# Patient Record
Sex: Female | Born: 1960 | Race: White | Hispanic: Yes | Marital: Married | Smoking: Never smoker
Health system: Southern US, Community
[De-identification: ages and names within clinical notes are randomized; demographics above are authoritative.]

## PROBLEM LIST (undated history)

## (undated) DIAGNOSIS — J45909 Unspecified asthma, uncomplicated: Secondary | ICD-10-CM

## (undated) DIAGNOSIS — E079 Disorder of thyroid, unspecified: Secondary | ICD-10-CM

## (undated) HISTORY — DX: Unspecified asthma, uncomplicated: J45.909

## (undated) HISTORY — DX: Disorder of thyroid, unspecified: E07.9

## (undated) HISTORY — PX: APPENDECTOMY: SHX54

---

## 1986-12-16 HISTORY — PX: HEMORRHOID SURGERY: SHX153

## 2011-05-22 ENCOUNTER — Ambulatory Visit: Payer: Self-pay | Admitting: Family Medicine

## 2012-08-20 ENCOUNTER — Ambulatory Visit: Payer: Self-pay | Admitting: Family Medicine

## 2012-09-01 ENCOUNTER — Ambulatory Visit: Payer: Self-pay | Admitting: Family Medicine

## 2012-11-18 ENCOUNTER — Ambulatory Visit: Payer: Self-pay | Admitting: General Surgery

## 2012-11-19 ENCOUNTER — Ambulatory Visit: Payer: Self-pay | Admitting: Family Medicine

## 2015-01-25 ENCOUNTER — Ambulatory Visit: Payer: Self-pay | Admitting: Obstetrics & Gynecology

## 2015-02-03 ENCOUNTER — Ambulatory Visit: Payer: Self-pay | Admitting: Obstetrics & Gynecology

## 2015-04-10 LAB — SURGICAL PATHOLOGY

## 2015-04-16 NOTE — Op Note (Signed)
PATIENT NAME:  Anna Mcgrath, Anna Mcgrath MR#:  [id number] DATE OF BIRTH:  [birth year]  DATE OF PROCEDURE:  02/03/2015  PREOPERATIVE DIAGNOSIS: Endometrial polyp and menorrhagia.   POSTOPERATIVE DIAGNOSIS:  Menorrhagia.   PROCEDURE: Dilation and curettage hysteroscopy.   SURGEON: Ranae Plumberhelsea Pinky Ravan, MD   ANESTHESIA: General.  OPERATIVE FLUIDS: 600 mL crystalloid.   URINE OUTPUT: Zero. Not performed.   ESTIMATED BLOOD LOSS: Minimal.   COMPLICATIONS: None apparent.   FINDINGS:  1.  Small endometrial cavity. No evidence of polyps, fluffy endometrial tissue consistent with the patient's menstrual status.  2.  Posterior cervix scarred in the posterior vaginal fornix.   SPECIMENS: Endometrial curettings.  CONDITION:  Stable to PACU.    INDICATION FOR PROCEDURE: The patient had complained of heavy menses and a sonohysterogram was performed, which showed evidence of an endometrial polyp in the posterior uterus. She was then scheduled to have dilation and curettage hysteroscopy for polypectomy. Informed consent was obtained.    OPERATIVE NOTE:  The patient was brought to the Operating Room where she was given general anesthesia in the supine position and placed in the dorsal lithotomy position and then prepped and draped in the usual sterile fashion. A surgical timeout was called. A bimanual exam was performed showing an anteverted and small uterus with scarring of the posterior cervix to the posterior vaginal fornix as described above.  The cervix was grasped at the 12 o'clock position with a single-tooth tenaculum and brought forward. The cervix was then dilated using Pratt dilators to accommodate the MyoSure scope which was then inserted without difficulty and findings as above. There was no endometrial polyp that was identified.  It is possible that the patient passed this on her previous menses or during this menses.  The MyoSure scope was then removed and gentle curettage was performed with tissue collected  and sent to pathology as endometrial curettings.  The hysteroscope was then reinserted for confirmation of completeness of the sampling of the cavity.  This was then removed.  All instruments were then removed from the patient and the procedure was deemed complete. The patient tolerated the procedure. The counts were correct x 2, and then the patient was brought to PACU in a stable condition for recovery.      ____________________________ Anna Fenderhelsea C. Alexys Gassett, MD ccw:DT D: 02/03/2015 14:24:56 ET T: 02/03/2015 14:39:48 ET JOB#: [id number]  cc: Leeroy Bockhelsea C. Clarissa Laird, MD, <Dictator> Leola BrazilHELSEA C Laylee Schooley MD ELECTRONICALLY SIGNED 02/03/2015 17:22

## 2015-12-26 ENCOUNTER — Other Ambulatory Visit: Payer: Self-pay | Admitting: Obstetrics & Gynecology

## 2015-12-26 DIAGNOSIS — Z1231 Encounter for screening mammogram for malignant neoplasm of breast: Secondary | ICD-10-CM

## 2015-12-28 ENCOUNTER — Other Ambulatory Visit: Payer: Self-pay | Admitting: Family Medicine

## 2015-12-28 DIAGNOSIS — N63 Unspecified lump in unspecified breast: Secondary | ICD-10-CM

## 2016-01-10 ENCOUNTER — Ambulatory Visit
Admission: RE | Admit: 2016-01-10 | Discharge: 2016-01-10 | Disposition: A | Discharge status: Home / Self Care | Payer: BLUE CROSS/BLUE SHIELD | Source: Ambulatory Visit | Attending: Family Medicine | Admitting: Family Medicine

## 2016-01-10 DIAGNOSIS — N63 Unspecified lump in unspecified breast: Secondary | ICD-10-CM

## 2016-01-11 ENCOUNTER — Other Ambulatory Visit: Payer: Self-pay | Admitting: Family Medicine

## 2016-01-11 DIAGNOSIS — N63 Unspecified lump in unspecified breast: Secondary | ICD-10-CM

## 2016-01-19 ENCOUNTER — Ambulatory Visit
Admission: RE | Admit: 2016-01-19 | Discharge: 2016-01-19 | Disposition: A | Discharge status: Home / Self Care | Payer: BLUE CROSS/BLUE SHIELD | Source: Ambulatory Visit | Attending: Family Medicine | Admitting: Family Medicine

## 2016-01-19 ENCOUNTER — Ambulatory Visit: Payer: BLUE CROSS/BLUE SHIELD

## 2016-01-19 ENCOUNTER — Other Ambulatory Visit: Payer: BLUE CROSS/BLUE SHIELD

## 2016-01-19 DIAGNOSIS — N63 Unspecified lump in unspecified breast: Secondary | ICD-10-CM

## 2016-01-19 DIAGNOSIS — N62 Hypertrophy of breast: Secondary | ICD-10-CM | POA: Diagnosis not present

## 2016-01-19 DIAGNOSIS — N6082 Other benign mammary dysplasias of left breast: Secondary | ICD-10-CM | POA: Diagnosis not present

## 2016-01-19 DIAGNOSIS — N6032 Fibrosclerosis of left breast: Secondary | ICD-10-CM | POA: Insufficient documentation

## 2016-01-19 HISTORY — PX: BREAST CYST ASPIRATION: SHX578

## 2016-01-22 LAB — SURGICAL PATHOLOGY

## 2016-01-31 HISTORY — PX: BREAST BIOPSY: SHX20

## 2017-02-18 ENCOUNTER — Other Ambulatory Visit: Payer: Self-pay | Admitting: Obstetrics & Gynecology

## 2017-02-18 DIAGNOSIS — Z1231 Encounter for screening mammogram for malignant neoplasm of breast: Secondary | ICD-10-CM

## 2017-03-05 ENCOUNTER — Ambulatory Visit: Payer: BLUE CROSS/BLUE SHIELD

## 2017-03-10 ENCOUNTER — Telehealth: Payer: Self-pay | Admitting: Obstetrics & Gynecology

## 2017-03-10 NOTE — Telephone Encounter (Signed)
[facility] sent in [facility] refill for patients Medication for Mimvey. Per Kim [place]azil[place] Pt needs to be schedule for annual before refilled. Pt former Ward Pt

## 2017-03-12 NOTE — Telephone Encounter (Signed)
Pt needs an annual exam due to Dr.Ward no longer with [facility]. Prescription not Authorized

## 2017-03-12 NOTE — Telephone Encounter (Signed)
Pt's prescription was faxed on March 22 for Refill on Mimvey1-0.5MG  Tablet

## 2017-04-01 ENCOUNTER — Ambulatory Visit
Admission: RE | Admit: 2017-04-01 | Discharge: 2017-04-01 | Disposition: A | Discharge status: Home / Self Care | Payer: BLUE CROSS/BLUE SHIELD | Source: Ambulatory Visit | Attending: Obstetrics & Gynecology | Admitting: Obstetrics & Gynecology

## 2017-04-01 DIAGNOSIS — Z1231 Encounter for screening mammogram for malignant neoplasm of breast: Secondary | ICD-10-CM | POA: Diagnosis present

## 2018-02-19 ENCOUNTER — Other Ambulatory Visit: Payer: Self-pay | Admitting: Obstetrics & Gynecology

## 2018-02-19 DIAGNOSIS — N644 Mastodynia: Secondary | ICD-10-CM

## 2018-03-03 ENCOUNTER — Ambulatory Visit
Admission: RE | Admit: 2018-03-03 | Discharge: 2018-03-03 | Disposition: A | Discharge status: Home / Self Care | Payer: BLUE CROSS/BLUE SHIELD | Source: Ambulatory Visit | Attending: Obstetrics & Gynecology | Admitting: Obstetrics & Gynecology

## 2018-03-03 ENCOUNTER — Other Ambulatory Visit: Payer: BLUE CROSS/BLUE SHIELD

## 2018-03-03 DIAGNOSIS — N644 Mastodynia: Secondary | ICD-10-CM

## 2018-03-03 DIAGNOSIS — N6002 Solitary cyst of left breast: Secondary | ICD-10-CM | POA: Diagnosis not present

## 2018-03-05 ENCOUNTER — Other Ambulatory Visit: Payer: Self-pay | Admitting: Obstetrics & Gynecology

## 2018-03-05 DIAGNOSIS — R928 Other abnormal and inconclusive findings on diagnostic imaging of breast: Secondary | ICD-10-CM

## 2018-03-05 DIAGNOSIS — N6002 Solitary cyst of left breast: Secondary | ICD-10-CM

## 2018-03-10 ENCOUNTER — Other Ambulatory Visit: Payer: Self-pay | Admitting: Obstetrics & Gynecology

## 2018-03-10 ENCOUNTER — Ambulatory Visit
Admission: RE | Admit: 2018-03-10 | Discharge: 2018-03-10 | Disposition: A | Discharge status: Home / Self Care | Payer: BLUE CROSS/BLUE SHIELD | Source: Ambulatory Visit | Attending: Obstetrics & Gynecology | Admitting: Obstetrics & Gynecology

## 2018-03-10 DIAGNOSIS — N6002 Solitary cyst of left breast: Secondary | ICD-10-CM

## 2018-03-10 DIAGNOSIS — R928 Other abnormal and inconclusive findings on diagnostic imaging of breast: Secondary | ICD-10-CM

## 2018-03-10 HISTORY — PX: BREAST CYST ASPIRATION: SHX578

## 2018-03-12 LAB — CYTOLOGY - NON PAP

## 2018-08-28 ENCOUNTER — Inpatient Hospital Stay: Payer: BLUE CROSS/BLUE SHIELD | Attending: Oncology | Admitting: Oncology

## 2018-08-28 ENCOUNTER — Inpatient Hospital Stay: Payer: BLUE CROSS/BLUE SHIELD | Admitting: Oncology

## 2018-08-28 ENCOUNTER — Encounter: Payer: Self-pay | Admitting: Oncology

## 2018-08-28 ENCOUNTER — Encounter (INDEPENDENT_AMBULATORY_CARE_PROVIDER_SITE_OTHER): Payer: Self-pay

## 2018-08-28 NOTE — Progress Notes (Signed)
Hematology/Oncology Consult note Anna Mcgrath [phone] Fax:([phone]   Patient Care Team: Lynnea FerrierKlein, Bert J III, MD as PCP - General (Internal Medicine)  REFERRING PROVIDER: Lynnea FerrierKlein, Bert J III, MD CHIEF COMPLAINTS/REASON FOR VISIT:  Evaluation of hemochromatosis.   HISTORY OF PRESENTING ILLNESS:  Anna Mcgrath is a  57 y.o.  female with PMH listed below who was referred to me for evaluation of hemochromatosis.  [facility]  Reviewed lab report from [facility]  08/10/2018 Ferritin 446, wbc 6.7, Hemoglobin 13.8, MCV 99, normal differential, TSH 1.56, Creatinine 0.79,  She had a repeat test done on 08/14/2018  ferritin 580, serum iron 178, iron saturation 63% TIBC 284.  B12 496, Folate 16.9.  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  She reports chronic fatigue and also hand joint pain from chronic arthritis.   Denies family history of hemochromatosis. Her parents are deceased. Patient feels that " my parents may have passed one hemochromatosis gene to me".   Review of Systems  Constitutional: Positive for malaise/fatigue. Negative for chills, fever and weight loss.  HENT: Negative for nosebleeds and sore throat.   Eyes: Negative for double vision, photophobia and redness.  Respiratory: Negative for cough, shortness of breath and wheezing.   Cardiovascular: Negative for chest pain, palpitations and orthopnea.  Gastrointestinal: Negative for abdominal pain, blood in stool, nausea and vomiting.  Genitourinary: Negative for dysuria.  Musculoskeletal: Positive for joint pain. Negative for back pain, myalgias and neck pain.  Skin: Negative for itching and rash.  Neurological: Negative for dizziness, tingling and tremors.  Endo/Heme/Allergies: Negative for environmental allergies. Does not bruise/bleed easily.  Psychiatric/Behavioral: Negative for depression.    MEDICAL HISTORY:  Past Medical  History:  Diagnosis Date  . Asthma   . Thyroid disease     SURGICAL HISTORY: Past Surgical History:  Procedure Laterality Date  . APPENDECTOMY    . BREAST BIOPSY Left 01/31/2016   Fibrocystic changes  . BREAST CYST ASPIRATION Left 01/19/2016  . HEMORRHOID SURGERY  1988    SOCIAL HISTORY: Social History   Socioeconomic History  . Marital status: Married    Spouse name: Jonny RuizJohn   . Number of children: 2  . Years of education: Not on file  . Highest education level: Not on file  Occupational History  . Not on file  Social Needs  . Financial resource strain: Not on file  . Food insecurity:    Worry: Not on file    Inability: Not on file  . Transportation needs:    Medical: Not on file    Non-medical: Not on file  Tobacco Use  . Smoking status: Never Smoker  . Smokeless tobacco: Never Used  Substance and Sexual Activity  . Alcohol use: Not Currently  . Drug use: Never  . Sexual activity: Not on file  Lifestyle  . Physical activity:    Days per week: Not on file    Minutes per session: Not on file  . Stress: Not on file  Relationships  . Social connections:    Talks on phone: Not on file    Gets together: Not on file    Attends religious service: Not on file    Active member of club or organization: Not on file    Attends meetings of clubs or organizations: Not on file    Relationship status: Not on file  . Intimate partner violence:    Fear of current or  ex partner: Not on file    Emotionally abused: Not on file    Physically abused: Not on file    Forced sexual activity: Not on file  Other Topics Concern  . Not on file  Social History Narrative  . Not on file    FAMILY HISTORY: Family History  Problem Relation Age of Onset  . Non-Hodgkin's lymphoma Brother   . Cancer Maternal Grandfather   . Breast cancer Neg Hx     ALLERGIES:  is allergic to azithromycin; gatifloxacin; and penicillin g.  MEDICATIONS:  Current Outpatient Medications  Medication  Sig Dispense Refill  . BIOTIN PO Take by mouth.    . budesonide (PULMICORT FLEXHALER) 180 MCG/ACT inhaler Inhale into the lungs.    . Cholecalciferol (VITAMIN D-1000 MAX ST) 1000 units tablet Take 1,000 Units by mouth daily.    Marland Kitchen estradiol-norethindrone (MIMVEY) 1-0.5 MG tablet TAKE 1 TABLET BY MOUTH EVERY DAY    . Flaxseed Oil (LINSEED OIL) OIL Use    . levalbuterol (XOPENEX HFA) 45 MCG/ACT inhaler Inhale into the lungs.    Marland Kitchen levothyroxine (SYNTHROID, LEVOTHROID) 88 MCG tablet Take 88 mcg by mouth daily.    Marland Kitchen spironolactone (ALDACTONE) 50 MG tablet Take 50 mg by mouth 2 (two) times daily.  5   No current facility-administered medications for this visit.      PHYSICAL EXAMINATION: ECOG PERFORMANCE STATUS: 0 - Asymptomatic Vitals:   08/28/18 1149  BP: 119/80  Pulse: 84  Resp: 16  Temp: 97.7 F (36.5 C)   Filed Weights   08/28/18 1149  Weight: 145 lb 9 oz (66 kg)    Physical Exam  Constitutional: She is oriented to person, place, and time. She appears well-nourished. No distress.  HENT:  Head: Normocephalic and atraumatic.  Right Ear: External ear normal.  Left Ear: External ear normal.  Mouth/Throat: Oropharynx is clear and moist.  Eyes: Pupils are equal, round, and reactive to light. EOM are normal. No scleral icterus.  Neck: Normal range of motion. Neck supple.  Cardiovascular: Normal rate, regular rhythm and normal heart sounds.  Pulmonary/Chest: Effort normal. No respiratory distress. She has no wheezes.  Abdominal: Soft. Bowel sounds are normal. She exhibits no distension and no mass. There is no tenderness.  Musculoskeletal: Normal range of motion. She exhibits no edema or deformity.  No joint swelling.   Neurological: She is alert and oriented to person, place, and time. No cranial nerve deficit. Coordination normal.  Skin: Skin is warm and dry. No rash noted. No erythema.  Psychiatric: She has a normal mood and affect. Her behavior is normal. Thought content  normal.     LABORATORY DATA:  I have reviewed the data as listed No results found for: WBC, HGB, HCT, MCV, PLT No results for input(s): NA, K, CL, CO2, GLUCOSE, BUN, CREATININE, CALCIUM, GFRNONAA, GFRAA, PROT, ALBUMIN, AST, ALT, ALKPHOS, BILITOT, BILIDIR, IBILI in the last 8760 hours. Iron/TIBC/Ferritin/ %Sat No results found for: IRON, TIBC, FERRITIN, IRONPCTSAT      ASSESSMENT & PLAN:  1. Iron overload   2. Hereditary hemochromatosis (HCC)    Diagnosis of hereditary hemochromatosis discussed with patient.  Advised patient to have first-degree relative screening for hemochromatosis.  Avoid alcohol consumption.  Avoid uncooked seafood. Patient voices understanding.  Check LFT, check US abdomen.   # recommend weekly Phlebotomy x 4. Patient asks if she can donate blood. Discussed with patient that she is able to donate blood. Check H&H prior to each phlebotomy.  Repeat iron  panel in 5 weeks and decide if further frequency of phlebotomy. [facility] orders provided to patient.   Orders Placed This Encounter  Procedures  . US Abdomen Complete    Standing Status:   Future    Standing Expiration Date:   08/29/2019    Order Specific Question:   Reason for Exam (SYMPTOM  OR DIAGNOSIS REQUIRED)    Answer:   hemochromatosis    Order Specific Question:   Preferred imaging location?    Answer:   [facility]    All questions were answered. The patient knows to call the clinic with any problems questions or concerns.  Return of visit: 5 weeks.  Thank you for this kind referral and the opportunity to participate in the care of this patient. A copy of today's note is routed to referring provider  Total face to face encounter time for this patient visit was 45 min. >50% of the time was  spent in counseling and coordination of care.    Rickard Patience, MD, PhD Hematology Oncology [facility] at [facility] Pager- [phone] 08/28/2018

## 2018-08-28 NOTE — Progress Notes (Signed)
Patient here today as a new patient with hereditary hemochromatosis

## 2018-08-30 ENCOUNTER — Encounter: Payer: Self-pay | Admitting: Oncology

## 2018-09-01 ENCOUNTER — Inpatient Hospital Stay: Payer: BLUE CROSS/BLUE SHIELD

## 2018-09-01 NOTE — Progress Notes (Signed)
Lab results reviewed with MD, Dr. Cathie HoopsYu. Per MD order: proceed with phlebotomy today.

## 2018-09-07 ENCOUNTER — Encounter: Payer: Self-pay | Admitting: Oncology

## 2018-09-08 ENCOUNTER — Inpatient Hospital Stay: Payer: BLUE CROSS/BLUE SHIELD

## 2018-09-09 ENCOUNTER — Encounter: Payer: Self-pay | Admitting: Oncology

## 2018-09-15 ENCOUNTER — Inpatient Hospital Stay: Payer: BLUE CROSS/BLUE SHIELD | Attending: Oncology

## 2018-09-15 ENCOUNTER — Inpatient Hospital Stay: Payer: BLUE CROSS/BLUE SHIELD

## 2018-09-15 MED ORDER — SODIUM CHLORIDE 0.9 % IV SOLN
Freq: Once | INTRAVENOUS | Status: AC
  Administered 2018-09-15: 14:00:00 via INTRAVENOUS
  Filled 2018-09-15: qty 250

## 2018-09-22 ENCOUNTER — Other Ambulatory Visit: Payer: Self-pay | Admitting: Oncology

## 2018-09-22 ENCOUNTER — Inpatient Hospital Stay: Payer: BLUE CROSS/BLUE SHIELD

## 2018-10-02 ENCOUNTER — Inpatient Hospital Stay: Payer: BLUE CROSS/BLUE SHIELD

## 2018-10-02 ENCOUNTER — Ambulatory Visit: Payer: BLUE CROSS/BLUE SHIELD | Admitting: Oncology

## 2018-10-02 ENCOUNTER — Other Ambulatory Visit: Payer: Self-pay

## 2018-10-02 ENCOUNTER — Inpatient Hospital Stay (HOSPITAL_BASED_OUTPATIENT_CLINIC_OR_DEPARTMENT_OTHER): Payer: BLUE CROSS/BLUE SHIELD | Admitting: Oncology

## 2018-10-02 ENCOUNTER — Encounter: Payer: Self-pay | Admitting: Oncology

## 2018-10-02 DIAGNOSIS — R5383 Other fatigue: Secondary | ICD-10-CM

## 2018-10-02 MED ORDER — SODIUM CHLORIDE 0.9 % IV SOLN
Freq: Once | INTRAVENOUS | Status: AC
  Administered 2018-10-02: 14:00:00 via INTRAVENOUS
  Filled 2018-10-02: qty 250

## 2018-10-02 NOTE — Progress Notes (Signed)
Therapeutic phlebotomy performed per MD order starting at 1414, and finishing at 1425 removing a total of per MD order. Pt tolerated well, and IVFs started per MD order.  Pt tolerated procedure well. Pt and VS stable at discharge.

## 2018-10-02 NOTE — Progress Notes (Signed)
Patient here for follow up. No concerns voiced.  °

## 2018-10-03 NOTE — Progress Notes (Signed)
Hematology/Oncology Consult note [facility] Telephone:([phone] Fax:([phone]) [phone]   Patient Care Team: Lynnea Ferrier, MD as PCP - General (Internal Medicine)  REFERRING PROVIDER: Lynnea Ferrier, MD REASON FOR VISIT Follow up for treatment of iron overload and hemochromatosis.   HISTORY OF PRESENTING ILLNESS:  Anna Mcgrath is a  57 y.o.  female with PMH listed below who was referred to me for evaluation of hemochromatosis.  Patient recent had lab work up done at [facility] office.  Reviewed lab report from [facility]  08/10/2018 Ferritin 446, wbc 6.7, Hemoglobin 13.8, MCV 99, normal differential, TSH 1.56, Creatinine 0.79,  She had a repeat test done on 08/14/2018  ferritin 580, serum iron 178, iron saturation 63% TIBC 284.  B12 496, Folate 16.9.  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  She reports chronic fatigue and also hand joint pain from chronic arthritis.   Denies family history of hemochromatosis. Her parents are deceased. Patient feels that " my parents may have passed one hemochromatosis gene to me".   INTERVAL HISTORY Anna Mcgrath is a 57 y.o. 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  S.p weekly phlebotomy 500cc x 3. She feels very fatigue and did not get 4th phlebotomy done.  Feels better today.    Review of Systems  Constitutional: Positive for malaise/fatigue. Negative for chills, fever and weight loss.  HENT: Negative for nosebleeds and sore throat.   Eyes: Negative for double vision, photophobia and redness.  Respiratory: Negative for cough, shortness of breath and wheezing.   Cardiovascular: Negative for chest pain, palpitations and orthopnea.  Gastrointestinal: Negative for abdominal pain, blood in stool, nausea and vomiting.  Genitourinary: Negative for dysuria.  Musculoskeletal: Positive for joint pain. Negative  for back pain, myalgias and neck pain.  Skin: Negative for itching and rash.  Neurological: Negative for dizziness, tingling and tremors.  Endo/Heme/Allergies: Negative for environmental allergies. Does not bruise/bleed easily.  Psychiatric/Behavioral: Negative for depression.    MEDICAL HISTORY:  Past Medical History:  Diagnosis Date  . Asthma   . Thyroid disease     SURGICAL HISTORY: Past Surgical History:  Procedure Laterality Date  . APPENDECTOMY    . BREAST BIOPSY Left 01/31/2016   Fibrocystic changes  . BREAST CYST ASPIRATION Left 01/19/2016  . HEMORRHOID SURGERY  1988    SOCIAL HISTORY: Social History   Socioeconomic History  . Marital status: Married    Spouse name: Jonny Ruiz   . Number of children: 2  . Years of education: Not on file  . Highest education level: Not on file  Occupational History  . Not on file  Social Needs  . Financial resource strain: Not on file  . Food insecurity:    Worry: Not on file    Inability: Not on file  . Transportation needs:    Medical: Not on file    Non-medical: Not on file  Tobacco Use  . Smoking status: Never Smoker  . Smokeless tobacco: Never Used  Substance and Sexual Activity  . Alcohol use: Not Currently  . Drug use: Never  . Sexual activity: Not on file  Lifestyle  . Physical activity:    Days per week: Not on file    Minutes per session: Not on file  . Stress: Not on file  Relationships  . Social connections:    Talks on phone: Not on file  Gets together: Not on file    Attends religious service: Not on file    Active member of club or organization: Not on file    Attends meetings of clubs or organizations: Not on file    Relationship status: Not on file  . Intimate partner violence:    Fear of current or ex partner: Not on file    Emotionally abused: Not on file    Physically abused: Not on file    Forced sexual activity: Not on file  Other Topics Concern  . Not on file  Social History Narrative  .  Not on file    FAMILY HISTORY: Family History  Problem Relation Age of Onset  . Non-Hodgkin's lymphoma Brother   . Cancer Maternal Grandfather   . Breast cancer Neg Hx     ALLERGIES:  is allergic to azithromycin; gatifloxacin; and penicillin g.  MEDICATIONS:  Current Outpatient Medications  Medication Sig Dispense Refill  . BIOTIN PO Take by mouth.    . budesonide (PULMICORT FLEXHALER) 180 MCG/ACT inhaler Inhale into the lungs.    . Cholecalciferol (VITAMIN D-1000 MAX ST) 1000 units tablet Take 1,000 Units by mouth daily.    Marland Kitchen estradiol-norethindrone (MIMVEY) 1-0.5 MG tablet TAKE 1 TABLET BY MOUTH EVERY DAY    . Flaxseed Oil (LINSEED OIL) OIL Use    . levalbuterol (XOPENEX HFA) 45 MCG/ACT inhaler Inhale into the lungs.    Marland Kitchen levothyroxine (SYNTHROID, LEVOTHROID) 88 MCG tablet Take 88 mcg by mouth daily.    Marland Kitchen spironolactone (ALDACTONE) 50 MG tablet Take 50 mg by mouth 2 (two) times daily.  5   No current facility-administered medications for this visit.      PHYSICAL EXAMINATION: ECOG PERFORMANCE STATUS: 0 - Asymptomatic Vitals:   10/02/18 1320  BP: (!) 130/91  Pulse: (!) 101  Resp: 18  Temp: (!) 97.1 F (36.2 C)   Filed Weights   10/02/18 1320  Weight: 147 lb 4.8 oz (66.8 kg)    Physical Exam  Constitutional: She is oriented to person, place, and time. She appears well-nourished. No distress.  HENT:  Head: Normocephalic and atraumatic.  Right Ear: External ear normal.  Left Ear: External ear normal.  Mouth/Throat: Oropharynx is clear and moist.  Eyes: Pupils are equal, round, and reactive to light. EOM are normal. No scleral icterus.  Neck: Normal range of motion. Neck supple.  Cardiovascular: Normal rate, regular rhythm and normal heart sounds.  Pulmonary/Chest: Effort normal. No respiratory distress. She has no wheezes.  Abdominal: Soft. Bowel sounds are normal. She exhibits no distension and no mass. There is no tenderness.  Musculoskeletal: Normal range of  motion. She exhibits no edema or deformity.  Neurological: She is alert and oriented to person, place, and time. No cranial nerve deficit. Coordination normal.  Skin: Skin is warm and dry. No rash noted. No erythema.  Psychiatric: She has a normal mood and affect. Her behavior is normal. Thought content normal.     LABORATORY DATA:  [facility] labs reviewed.   Hemoglobin 12, ferritin 186.    ASSESSMENT & PLAN:  1. Hereditary hemochromatosis (HCC)   2. Iron overload   3. Other fatigue    # Labs reviewed and discussed with patient.  Ferritin has responded to phlebotomy and decreased to 186.  Discussed with patient that her goal of ferritin will at least less than 100, preferably less than 50.  And it is usually acceptable for phlebotomize to a mild anemia state if patient tolerates.  Since she is feeling fatigue, will space out phlebotomy frequency to every 2 weeks and also reduce to for each phlebotomy with holding parameter of Hb <11.   US abdomen was previously ordered and patient did not want to do now, wants to delay it to next year.  # Proceed with phlebotomy today.  recommend Q2 weeks Phlebotomy x 3 check H&H prior to each phlebotomy.   Repeat iron panel in 8- 10 weeks and decide if further frequency of phlebotomy. [facility] orders provided to patient.    Patient is quite anxious and has lot of questions. We spent sufficient time to discuss many aspect of care, questions were answered to patient's satisfaction. The patient knows to call the clinic with any problems questions or concerns.  Return of visit: 8-10 weeks.  Total face to face encounter time for this patient visit was 15 min. >50% of the time was  spent in counseling and coordination of care.  Rickard Patience, MD, PhD Hematology Oncology [facility] Pager- [phone] 10/03/2018

## 2018-10-20 ENCOUNTER — Inpatient Hospital Stay: Payer: BLUE CROSS/BLUE SHIELD | Attending: Oncology

## 2018-10-20 MED ORDER — SODIUM CHLORIDE 0.9 % IV SOLN
Freq: Once | INTRAVENOUS | Status: AC
  Administered 2018-10-20: 14:00:00 via INTRAVENOUS
  Filled 2018-10-20: qty 250

## 2018-11-03 ENCOUNTER — Inpatient Hospital Stay: Payer: BLUE CROSS/BLUE SHIELD

## 2018-11-03 MED ORDER — SODIUM CHLORIDE 0.9 % IV SOLN
Freq: Once | INTRAVENOUS | Status: AC
  Administered 2018-11-03: 14:00:00 via INTRAVENOUS
  Filled 2018-11-03: qty 250

## 2018-11-17 ENCOUNTER — Inpatient Hospital Stay: Payer: BLUE CROSS/BLUE SHIELD

## 2018-11-20 ENCOUNTER — Inpatient Hospital Stay: Payer: BLUE CROSS/BLUE SHIELD | Attending: Oncology

## 2018-11-20 MED ORDER — SODIUM CHLORIDE 0.9 % IV SOLN
Freq: Once | INTRAVENOUS | Status: AC
  Administered 2018-11-20: 14:00:00 via INTRAVENOUS
  Filled 2018-11-20: qty 250

## 2018-11-20 NOTE — Progress Notes (Signed)
Therapeutic phlebotomy performed per MD order starting at 1343 and ending at 1356 using a 20 gauge PIV in left AC, removing a total of 300cc. Pt tolerated procedure well.  Pt and VS stable at discharge.

## 2018-12-01 ENCOUNTER — Inpatient Hospital Stay (HOSPITAL_BASED_OUTPATIENT_CLINIC_OR_DEPARTMENT_OTHER): Payer: BLUE CROSS/BLUE SHIELD | Admitting: Oncology

## 2018-12-01 ENCOUNTER — Encounter: Payer: Self-pay | Admitting: Oncology

## 2018-12-01 ENCOUNTER — Other Ambulatory Visit: Payer: Self-pay

## 2018-12-01 ENCOUNTER — Inpatient Hospital Stay: Payer: BLUE CROSS/BLUE SHIELD

## 2018-12-01 NOTE — Progress Notes (Signed)
Hematology/Oncology follow up note [facility] Telephone:([phone] Fax:(336) [phone]   Patient Care Team: Lynnea Ferrier, MD as PCP - General (Internal Medicine)  REFERRING PROVIDER: Lynnea Ferrier, MD REASON FOR VISIT Follow up for treatment of iron overload and hemochromatosis.   HISTORY OF PRESENTING ILLNESS:  Anna Mcgrath is a  57 y.o.  female with PMH listed below who was referred to me for evaluation of hemochromatosis.  Patient recent had lab work up done at [facility] office.  Reviewed lab report from [facility].  08/10/2018 Ferritin 446, wbc 6.7, Hemoglobin 13.8, MCV 99, normal differential, TSH 1.56, Creatinine 0.79,  She had a repeat test done on 08/14/2018  ferritin 580, serum iron 178, iron saturation 63% TIBC 284.  B12 496, Folate 16.9.  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  She reports chronic fatigue and also hand joint pain from chronic arthritis.   Denies family history of hemochromatosis. Her parents are deceased. Patient feels that " my parents may have passed one hemochromatosis gene to me".   INTERVAL HISTORY Anna Mcgrath is a 57 y.o. 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   Reports feeling well today. Having stomach discomfort including nausea, bloating, acid reflux. She was started omeprazole.  Fatigue is better. No new complaints.    Review of Systems  Constitutional: Negative for chills, fever, malaise/fatigue and weight loss.  HENT: Negative for sore throat.   Eyes: Negative for redness.  Respiratory: Negative for cough, shortness of breath and wheezing.   Cardiovascular: Negative for chest pain, palpitations and leg swelling.  Gastrointestinal: Positive for heartburn and nausea. Negative for abdominal pain, blood in stool and vomiting.  Genitourinary: Negative for dysuria.  Musculoskeletal: Negative  for myalgias.  Skin: Negative for rash.  Neurological: Negative for dizziness, tingling and tremors.  Endo/Heme/Allergies: Does not bruise/bleed easily.  Psychiatric/Behavioral: Negative for hallucinations.    MEDICAL HISTORY:  Past Medical History:  Diagnosis Date  . Asthma   . Thyroid disease     SURGICAL HISTORY: Past Surgical History:  Procedure Laterality Date  . APPENDECTOMY    . BREAST BIOPSY Left 01/31/2016   Fibrocystic changes  . BREAST CYST ASPIRATION Left 01/19/2016  . HEMORRHOID SURGERY  1988    SOCIAL HISTORY: Social History   Socioeconomic History  . Marital status: Married    Spouse name: Jonny Ruiz   . Number of children: 2  . Years of education: Not on file  . Highest education level: Not on file  Occupational History  . Not on file  Social Needs  . Financial resource strain: Not on file  . Food insecurity:    Worry: Not on file    Inability: Not on file  . Transportation needs:    Medical: Not on file    Non-medical: Not on file  Tobacco Use  . Smoking status: Never Smoker  . Smokeless tobacco: Never Used  Substance and Sexual Activity  . Alcohol use: Not Currently  . Drug use: Never  . Sexual activity: Not on file  Lifestyle  . Physical activity:    Days per week: Not on file    Minutes per session: Not on file  . Stress: Not on file  Relationships  . Social connections:    Talks on phone: Not on file    Gets together: Not on file    Attends religious service: Not  on file    Active member of club or organization: Not on file    Attends meetings of clubs or organizations: Not on file    Relationship status: Not on file  . Intimate partner violence:    Fear of current or ex partner: Not on file    Emotionally abused: Not on file    Physically abused: Not on file    Forced sexual activity: Not on file  Other Topics Concern  . Not on file  Social History Narrative  . Not on file    FAMILY HISTORY: Family History  Problem Relation  Age of Onset  . Non-Hodgkin's lymphoma Brother   . Cancer Maternal Grandfather   . Breast cancer Neg Hx     ALLERGIES:  is allergic to azithromycin; gatifloxacin; and penicillin g.  MEDICATIONS:  Current Outpatient Medications  Medication Sig Dispense Refill  . budesonide (PULMICORT FLEXHALER) 180 MCG/ACT inhaler Inhale into the lungs.    Marland Kitchen. estradiol-norethindrone (MIMVEY) 1-0.5 MG tablet TAKE 1 TABLET BY MOUTH EVERY DAY    . levalbuterol (XOPENEX HFA) 45 MCG/ACT inhaler Inhale into the lungs.    Marland Kitchen. levothyroxine (SYNTHROID, LEVOTHROID) 88 MCG tablet Take 88 mcg by mouth daily.    Marland Kitchen. spironolactone (ALDACTONE) 50 MG tablet Take 50 mg by mouth 2 (two) times daily.  5  . BIOTIN PO Take by mouth.    . Cholecalciferol (VITAMIN D-1000 MAX ST) 1000 units tablet Take 1,000 Units by mouth daily.    . Flaxseed Oil (LINSEED OIL) OIL Use    . omeprazole (PRILOSEC) 20 MG capsule Take 20 mg by mouth 2 (two) times daily before a meal.   1   No current facility-administered medications for this visit.      PHYSICAL EXAMINATION: ECOG PERFORMANCE STATUS: 0 - Asymptomatic Vitals:   12/01/18 1029  BP: 138/81  Pulse: 72  Temp: (!) 97 F (36.1 C)   Filed Weights   12/01/18 1029  Weight: 145 lb 11.2 oz (66.1 kg)    Physical Exam Constitutional:      General: She is not in acute distress. HENT:     Head: Normocephalic and atraumatic.     Right Ear: External ear normal.     Left Ear: External ear normal.  Eyes:     General: No scleral icterus.    Pupils: Pupils are equal, round, and reactive to light.  Neck:     Musculoskeletal: Normal range of motion and neck supple.  Cardiovascular:     Rate and Rhythm: Normal rate and regular rhythm.     Heart sounds: Normal heart sounds.  Pulmonary:     Effort: Pulmonary effort is normal. No respiratory distress.     Breath sounds: No wheezing.  Abdominal:     General: Bowel sounds are normal. There is no distension.     Palpations: Abdomen is  soft. There is no mass.     Tenderness: There is no abdominal tenderness.  Musculoskeletal: Normal range of motion.        General: No deformity.  Skin:    General: Skin is warm and dry.     Findings: No erythema or rash.  Neurological:     Mental Status: She is alert and oriented to person, place, and time.     Cranial Nerves: No cranial nerve deficit.     Coordination: Coordination normal.  Psychiatric:        Behavior: Behavior normal.        Thought Content:  Thought content normal.      LABORATORY DATA:  [facility] labs reviewed.  11/27/2018 Hemoglobin 12, ferritin 23   ASSESSMENT & PLAN:  1. Hereditary hemochromatosis (HCC)   2. Iron overload    # Labs done at [facility] were reviewed and discussed with patient.  Ferritin has decreased to 23, at goal. Normal hemoglobin, no anemia.  Hold phlebtomy today.  Repeat lab tests cbc, iron ferritin in 2 months and be evaluated need of repeat phlebotomy. [facility] orders provided to patient. [place] right upper quadrant was previously discussed with patient and she prefers to have test done  at [facility].  Orders Placed This Encounter  Procedures  . US Abdomen Limited    Wt. 145 Ins. bcbs NO needs FH w [place]lette Pt aware of 301 site    Standing Status:   Future    Standing Expiration Date:   12/01/2019    Order Specific Question:   Reason for Exam (SYMPTOM  OR DIAGNOSIS REQUIRED)    Answer:   assess for splenomegaly, thrombocytopenia    Order Specific Question:   Preferred imaging location?    Answer:   [facility]    The patient knows to call the clinic with any problems questions or concerns.  Return of visit:2 months Total face to face encounter time for this patient visit was 15 min. >50% of the time was  spent in counseling and coordination of care.    Rickard Patience, MD, PhD Hematology Oncology [facility] at [facility] Pager- [phone] 12/01/2018

## 2018-12-01 NOTE — Progress Notes (Signed)
Patient here for follow up. Patient complains of having stomach problems which include: nausea, bloating, gas, burping, stomach pain. All this started November 26.

## 2018-12-02 ENCOUNTER — Other Ambulatory Visit: Payer: Self-pay | Admitting: *Deleted

## 2018-12-02 ENCOUNTER — Ambulatory Visit: Payer: BLUE CROSS/BLUE SHIELD | Admitting: Oncology

## 2018-12-03 ENCOUNTER — Ambulatory Visit: Payer: BLUE CROSS/BLUE SHIELD

## 2018-12-03 ENCOUNTER — Other Ambulatory Visit: Payer: Self-pay | Admitting: *Deleted

## 2018-12-03 ENCOUNTER — Other Ambulatory Visit: Payer: Self-pay | Admitting: Oncology

## 2018-12-03 ENCOUNTER — Other Ambulatory Visit: Payer: BLUE CROSS/BLUE SHIELD

## 2018-12-10 ENCOUNTER — Ambulatory Visit: Payer: BLUE CROSS/BLUE SHIELD

## 2018-12-30 ENCOUNTER — Ambulatory Visit: Payer: BLUE CROSS/BLUE SHIELD

## 2018-12-30 ENCOUNTER — Ambulatory Visit
Admission: RE | Admit: 2018-12-30 | Discharge: 2018-12-30 | Disposition: A | Discharge status: Home / Self Care | Payer: BLUE CROSS/BLUE SHIELD | Source: Ambulatory Visit | Attending: Oncology | Admitting: Oncology

## 2019-01-08 ENCOUNTER — Other Ambulatory Visit: Payer: Self-pay | Admitting: Student

## 2019-01-08 DIAGNOSIS — R140 Abdominal distension (gaseous): Secondary | ICD-10-CM

## 2019-01-08 DIAGNOSIS — R110 Nausea: Secondary | ICD-10-CM

## 2019-01-08 DIAGNOSIS — R1033 Periumbilical pain: Secondary | ICD-10-CM

## 2019-01-18 ENCOUNTER — Ambulatory Visit
Admission: RE | Admit: 2019-01-18 | Discharge: 2019-01-18 | Disposition: A | Discharge status: Home / Self Care | Payer: BLUE CROSS/BLUE SHIELD | Source: Ambulatory Visit | Attending: Student | Admitting: Student

## 2019-01-18 DIAGNOSIS — R1033 Periumbilical pain: Secondary | ICD-10-CM | POA: Diagnosis not present

## 2019-01-18 DIAGNOSIS — R110 Nausea: Secondary | ICD-10-CM | POA: Diagnosis present

## 2019-01-18 DIAGNOSIS — R140 Abdominal distension (gaseous): Secondary | ICD-10-CM | POA: Insufficient documentation

## 2019-01-18 MED ORDER — IOPAMIDOL (ISOVUE-300) INJECTION 61%
100.0000 mL | Freq: Once | INTRAVENOUS | Status: AC | PRN
  Administered 2019-01-18: 100 mL via INTRAVENOUS

## 2019-02-01 ENCOUNTER — Inpatient Hospital Stay: Payer: BLUE CROSS/BLUE SHIELD

## 2019-02-01 ENCOUNTER — Inpatient Hospital Stay: Payer: BLUE CROSS/BLUE SHIELD | Admitting: Oncology

## 2019-02-02 ENCOUNTER — Inpatient Hospital Stay: Payer: BLUE CROSS/BLUE SHIELD | Attending: Oncology | Admitting: Oncology

## 2019-02-02 ENCOUNTER — Inpatient Hospital Stay: Payer: BLUE CROSS/BLUE SHIELD

## 2019-02-02 ENCOUNTER — Encounter: Payer: Self-pay | Admitting: Oncology

## 2019-02-02 ENCOUNTER — Other Ambulatory Visit: Payer: Self-pay

## 2019-02-02 NOTE — Progress Notes (Signed)
Patient here for follow up. Pt states that she has been feeling nauseated and bloated and she is scheduled for endoscopy next week. Pt currently on prednisone taper. She just finished antibiotic for UTI.

## 2019-02-02 NOTE — Progress Notes (Signed)
Hematology/Oncology follow up note [facility] Telephone:(336) [phone] Fax:(336) [phone]   Patient Care Team: Lynnea Ferrier, MD as PCP - General (Internal Medicine)  REFERRING PROVIDER: Lynnea Ferrier, MD REASON FOR VISIT Follow up for treatment of iron overload and hemochromatosis.   HISTORY OF PRESENTING ILLNESS:  Anna Mcgrath is a  58 y.o.  female with PMH listed below who was referred to me for evaluation of hemochromatosis.  Patient recent had lab work up done at [facility] office.  Reviewed lab report from [facility].  08/10/2018 Ferritin 446, wbc 6.7, Hemoglobin 13.8, MCV 99, normal differential, TSH 1.56, Creatinine 0.79,  She had a repeat test done on 08/14/2018  ferritin 580, serum iron 178, iron saturation 63% TIBC 284.  B12 496, Folate 16.9.  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  She reports chronic fatigue and also hand joint pain from chronic arthritis.   Denies family history of hemochromatosis. Her parents are deceased. Patient feels that " my parents may have passed one hemochromatosis gene to me".   INTERVAL HISTORY Anna Mcgrath is a 58 y.o. 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   Patient reports fatigue has improved since noted on phlebotomy. No new complaints. Denies any skin darkening, skin rash, chest pain, pain, shortness of breath. She asks for son to talk to his PCP and get iron checked due to hemochromatosis.    Review of Systems  Constitutional: Negative for chills, fever, malaise/fatigue and weight loss.  HENT: Negative for sore throat.   Eyes: Negative for redness.  Respiratory: Negative for cough, shortness of breath and wheezing.   Cardiovascular: Negative for chest pain, palpitations and leg swelling.  Gastrointestinal: Negative for abdominal pain, blood in stool, heartburn, nausea and vomiting.    Genitourinary: Negative for dysuria.  Musculoskeletal: Negative for myalgias.  Skin: Negative for rash.  Neurological: Negative for dizziness, tingling and tremors.  Endo/Heme/Allergies: Does not bruise/bleed easily.  Psychiatric/Behavioral: Negative for hallucinations.    MEDICAL HISTORY:  Past Medical History:  Diagnosis Date  . Asthma   . Thyroid disease     SURGICAL HISTORY: Past Surgical History:  Procedure Laterality Date  . APPENDECTOMY    . BREAST BIOPSY Left 01/31/2016   Fibrocystic changes  . BREAST CYST ASPIRATION Left 01/19/2016  . HEMORRHOID SURGERY  1988    SOCIAL HISTORY: Social History   Socioeconomic History  . Marital status: Married    Spouse name: Jonny Ruiz   . Number of children: 2  . Years of education: Not on file  . Highest education level: Not on file  Occupational History  . Not on file  Social Needs  . Financial resource strain: Not on file  . Food insecurity:    Worry: Not on file    Inability: Not on file  . Transportation needs:    Medical: Not on file    Non-medical: Not on file  Tobacco Use  . Smoking status: Never Smoker  . Smokeless tobacco: Never Used  Substance and Sexual Activity  . Alcohol use: Not Currently  . Drug use: Never  . Sexual activity: Not on file  Lifestyle  . Physical activity:    Days per week: Not on file    Minutes per session: Not on file  . Stress: Not on file  Relationships  . Social connections:    Talks on phone: Not on file  Gets together: Not on file    Attends religious service: Not on file    Active member of club or organization: Not on file    Attends meetings of clubs or organizations: Not on file    Relationship status: Not on file  . Intimate partner violence:    Fear of current or ex partner: Not on file    Emotionally abused: Not on file    Physically abused: Not on file    Forced sexual activity: Not on file  Other Topics Concern  . Not on file  Social History Narrative  . Not  on file    FAMILY HISTORY: Family History  Problem Relation Age of Onset  . Non-Hodgkin's lymphoma Brother   . Cancer Maternal Grandfather   . Breast cancer Neg Hx     ALLERGIES:  is allergic to azithromycin; gatifloxacin; and penicillin g.  MEDICATIONS:  Current Outpatient Medications  Medication Sig Dispense Refill  . budesonide (PULMICORT FLEXHALER) 180 MCG/ACT inhaler Inhale into the lungs.    Marland Kitchen estradiol-norethindrone (MIMVEY) 1-0.5 MG tablet TAKE 1 TABLET BY MOUTH EVERY DAY    . levalbuterol (XOPENEX HFA) 45 MCG/ACT inhaler Inhale into the lungs.    Marland Kitchen levothyroxine (SYNTHROID, LEVOTHROID) 88 MCG tablet Take 88 mcg by mouth daily.    Marland Kitchen omeprazole (PRILOSEC) 20 MG capsule Take 20 mg by mouth 2 (two) times daily before a meal.   1  . predniSONE (STERAPRED UNI-PAK 21 TAB) 10 MG (21) TBPK tablet     . spironolactone (ALDACTONE) 50 MG tablet Take 50 mg by mouth 2 (two) times daily.  5  . BIOTIN PO Take by mouth.    . Cholecalciferol (VITAMIN D-1000 MAX ST) 1000 units tablet Take 1,000 Units by mouth daily.    . Flaxseed Oil (LINSEED OIL) OIL Use     No current facility-administered medications for this visit.      PHYSICAL EXAMINATION: ECOG PERFORMANCE STATUS: 0 - Asymptomatic There were no vitals filed for this visit. There were no vitals filed for this visit.  Physical Exam Constitutional:      General: She is not in acute distress. HENT:     Head: Normocephalic and atraumatic.     Right Ear: External ear normal.     Left Ear: External ear normal.  Eyes:     General: No scleral icterus.    Pupils: Pupils are equal, round, and reactive to light.  Neck:     Musculoskeletal: Normal range of motion and neck supple.  Cardiovascular:     Rate and Rhythm: Normal rate and regular rhythm.     Heart sounds: Normal heart sounds.  Pulmonary:     Effort: Pulmonary effort is normal. No respiratory distress.     Breath sounds: No wheezing.  Abdominal:     General: Bowel  sounds are normal. There is no distension.     Palpations: Abdomen is soft. There is no mass.     Tenderness: There is no abdominal tenderness.  Musculoskeletal: Normal range of motion.        General: No deformity.  Skin:    General: Skin is warm and dry.     Findings: No erythema or rash.  Neurological:     Mental Status: She is alert and oriented to person, place, and time.     Cranial Nerves: No cranial nerve deficit.     Coordination: Coordination normal.  Psychiatric:        Behavior: Behavior normal.  Thought Content: Thought content normal.      LABORATORY DATA:  [facility] labs reviewed.  11/27/2018 Hemoglobin 12, ferritin 23  01/25/2019 TSAT 31% ferritin 20 hemoglobin 13.  RADIOGRAPHIC STUDIES: I have personally reviewed the radiological images as listed and agreed with the findings in the report. 12/30/2018 ultrasound abdomen limited: Unremarkable right upper quadrant ultrasound. 01/18/2019 CT abdomen pelvis with contrast showed moderate fecal loading throughout the length of his prominent cystitis not excluded.  No other acute abnormalities   ASSESSMENT & PLAN:  1. Hereditary hemochromatosis (HCC)   2. Iron overload    # Labs done at [facility] were reviewed and discussed with patient.  Ferritin is stable at 20, at goal.  TSAT 31% normal hemoglobin, no anemia.  Hold phlebotomy today. Recommend patient to repeat CBC, CMP, 3 months. Return to clinic for MD assessment, lab 6 months. Ultrasound right upper quadrant was independently reviewed by me discussed with patient.  [facility] prescriptions were provided to patient. The patient knows to call the clinic with any problems questions or concerns.  Return of visit: 6 months.   Rickard PatienceZhou Admire Bunnell, MD, PhD Hematology Oncology [facility] Pager- [phone] 02/02/2019

## 2019-08-03 ENCOUNTER — Inpatient Hospital Stay: Payer: BC Managed Care – PPO

## 2019-08-03 ENCOUNTER — Inpatient Hospital Stay: Payer: BC Managed Care – PPO | Attending: Oncology | Admitting: Oncology

## 2019-08-03 ENCOUNTER — Other Ambulatory Visit: Payer: Self-pay

## 2019-08-03 ENCOUNTER — Encounter: Payer: Self-pay | Admitting: Oncology

## 2019-08-03 DIAGNOSIS — E079 Disorder of thyroid, unspecified: Secondary | ICD-10-CM | POA: Insufficient documentation

## 2019-08-03 NOTE — Progress Notes (Signed)
Hematology/Oncology follow up note [facility] Telephone:(336) [phone] Fax:(336) [phone]   Patient Care Team: Lynnea FerrierKlein, Bert J III, MD as PCP - General (Internal Medicine)  REFERRING PROVIDER: Lynnea FerrierKlein, Bert J III, MD REASON FOR VISIT Follow up for treatment of iron overload and hemochromatosis.   HISTORY OF PRESENTING ILLNESS:  Anna Mcgrath is a  58 y.o.  female with PMH listed below who was referred to me for evaluation of hemochromatosis.  [facility]  Reviewed lab report from [facility]  08/10/2018 Ferritin 446, wbc 6.7, Hemoglobin 13.8, MCV 99, normal differential, TSH 1.56, Creatinine 0.79,  She had a repeat test done on 08/14/2018  ferritin 580, serum iron 178, iron saturation 63% TIBC 284.  B12 496, Folate 16.9.  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  She reports chronic fatigue and also hand joint pain from chronic arthritis.   Denies family history of hemochromatosis. Her parents are deceased. Patient feels that " my parents may have passed one hemochromatosis gene to me".   INTERVAL HISTORY Anna Mcgrath is a 58 y.o. 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  Patient reports feeling well. Chronic fatigue has improved. Last phlebotomy was at least 7338-month ago. She has been careful about her diet. No alcohol consumption. Denies any skin darkening, skin rash, chest pain, abdominal pain, or shortness of breath .   Review of Systems  Constitutional: Negative for chills, fever, malaise/fatigue and weight loss.  HENT: Negative for sore throat.   Eyes: Negative for redness.  Respiratory: Negative for cough, shortness of breath and wheezing.   Cardiovascular: Negative for chest pain, palpitations and leg swelling.  Gastrointestinal: Negative for abdominal pain, blood in stool, heartburn, nausea and  vomiting.  Genitourinary: Negative for dysuria.  Musculoskeletal: Negative for myalgias.  Skin: Negative for rash.  Neurological: Negative for dizziness, tingling and tremors.  Endo/Heme/Allergies: Does not bruise/bleed easily.  Psychiatric/Behavioral: Negative for hallucinations.    MEDICAL HISTORY:  Past Medical History:  Diagnosis Date  . Asthma   . Thyroid disease     SURGICAL HISTORY: Past Surgical History:  Procedure Laterality Date  . APPENDECTOMY    . BREAST BIOPSY Left 01/31/2016   Fibrocystic changes  . BREAST CYST ASPIRATION Left 01/19/2016  . HEMORRHOID SURGERY  1988    SOCIAL HISTORY: Social History   Socioeconomic History  . Marital status: Married    Spouse name: Jonny RuizJohn   . Number of children: 2  . Years of education: Not on file  . Highest education level: Not on file  Occupational History  . Not on file  Social Needs  . Financial resource strain: Not on file  . Food insecurity    Worry: Not on file    Inability: Not on file  . Transportation needs    Medical: Not on file    Non-medical: Not on file  Tobacco Use  . Smoking status: Never Smoker  . Smokeless tobacco: Never Used  Substance and Sexual Activity  . Alcohol use: Not Currently  . Drug use: Never  . Sexual activity: Not on file  Lifestyle  . Physical activity    Days per week: Not on file    Minutes per session: Not on file  . Stress: Not on file  Relationships  . Social connections    Talks on phone: Not on file  Gets together: Not on file    Attends religious service: Not on file    Active member of club or organization: Not on file    Attends meetings of clubs or organizations: Not on file    Relationship status: Not on file  . Intimate partner violence    Fear of current or ex partner: Not on file    Emotionally abused: Not on file    Physically abused: Not on file    Forced sexual activity: Not on file  Other Topics Concern  . Not on file  Social History Narrative   . Not on file    FAMILY HISTORY: Family History  Problem Relation Age of Onset  . Non-Hodgkin's lymphoma Brother   . Cancer Maternal Grandfather   . Breast cancer Neg Hx     ALLERGIES:  is allergic to azithromycin; gatifloxacin; and penicillin g.  MEDICATIONS:  Current Outpatient Medications  Medication Sig Dispense Refill  . BIOTIN PO Take by mouth.    . budesonide (PULMICORT FLEXHALER) 180 MCG/ACT inhaler Inhale into the lungs.    . Cholecalciferol (VITAMIN D-1000 MAX ST) 1000 units tablet Take 1,000 Units by mouth daily.    Marland Kitchen. estradiol-norethindrone (MIMVEY) 1-0.5 MG tablet TAKE 1 TABLET BY MOUTH EVERY DAY    . estradiol-norethindrone (MIMVEY) 1-0.5 MG tablet TAKE 1 TABLET BY MOUTH EVERY DAY    . Flaxseed Oil (LINSEED OIL) OIL Use    . levalbuterol (XOPENEX HFA) 45 MCG/ACT inhaler Inhale into the lungs.    Marland Kitchen. levothyroxine (SYNTHROID, LEVOTHROID) 88 MCG tablet Take 88 mcg by mouth daily.    Marland Kitchen. omeprazole (PRILOSEC) 20 MG capsule Take 20 mg by mouth 2 (two) times daily before a meal.   1  . omeprazole (PRILOSEC) 20 MG capsule TAKE 1 CAPSULE BY MOUTH TWICE A DAY    . predniSONE (STERAPRED UNI-PAK 21 TAB) 10 MG (21) TBPK tablet     . spironolactone (ALDACTONE) 50 MG tablet Take 50 mg by mouth 2 (two) times daily.  5  . budesonide (PULMICORT FLEXHALER) 180 MCG/ACT inhaler Inhale into the lungs.    Marland Kitchen. levalbuterol (XOPENEX) 1.25 MG/3ML nebulizer solution INHALE 3 MILLILITERS (1.25 MG) BY NEBULIZATION ROUTE 3 TIMES PER DAY AS NEEDED    . PROAIR HFA 108 (90 Base) MCG/ACT inhaler TAKE 2 PUFFS BY MOUTH EVERY 4 TO 6 HOURS AS NEEDED     No current facility-administered medications for this visit.      PHYSICAL EXAMINATION: ECOG PERFORMANCE STATUS: 0 - Asymptomatic Vitals:   08/03/19 1405  BP: 135/89  Pulse: 66  Temp: (!) 97.1 F (36.2 C)   Filed Weights   08/03/19 1405  Weight: 153 lb (69.4 kg)    Physical Exam Constitutional:      General: She is not in acute distress.  HENT:     Head: Normocephalic and atraumatic.     Right Ear: External ear normal.     Left Ear: External ear normal.  Eyes:     General: No scleral icterus.    Pupils: Pupils are equal, round, and reactive to light.  Neck:     Musculoskeletal: Normal range of motion and neck supple.  Cardiovascular:     Rate and Rhythm: Normal rate and regular rhythm.     Heart sounds: Normal heart sounds.  Pulmonary:     Effort: Pulmonary effort is normal. No respiratory distress.     Breath sounds: No wheezing.  Abdominal:     General: Bowel sounds are  normal. There is no distension.     Palpations: Abdomen is soft. There is no mass.     Tenderness: There is no abdominal tenderness.  Musculoskeletal: Normal range of motion.        General: No deformity.  Skin:    General: Skin is warm and dry.     Findings: No erythema or rash.  Neurological:     Mental Status: She is alert and oriented to person, place, and time.     Cranial Nerves: No cranial nerve deficit.     Coordination: Coordination normal.  Psychiatric:        Behavior: Behavior normal.        Thought Content: Thought content normal.      LABORATORY DATA:  [facility] labs reviewed.  11/27/2018 Hemoglobin 12, ferritin 23  01/25/2019 TSAT 31% ferritin 20 hemoglobin 13.  RADIOGRAPHIC STUDIES: I have personally reviewed the radiological images as listed and agreed with the findings in the report. 12/30/2018 ultrasound abdomen limited: Unremarkable right upper quadrant ultrasound. 01/18/2019 CT abdomen pelvis with contrast showed moderate fecal loading throughout the length of his prominent cystitis not excluded.  No other acute abnormalities   ASSESSMENT & PLAN:  1. Hereditary hemochromatosis ([place])   2. Iron overload    # Labs done at [place] were reviewed and discussed with patient.  05/11/2019 hemoglobin 14.8, hematocrit 43.2, iron saturation 23%, ferritin 41 07/28/2019 hemoglobin 14.7, hematocrit 44.9 MCV 100 iron saturation  40%, ferritin 32 Hold phlebotomy. Repeat blood work in 3 months. Follow-up in the clinic in 6 months with repeat blood work for evaluation of additional need of phlebotomy. [facility] prescriptions were provided to patient. The patient knows to call the clinic with any problems questions or concerns.  Return of visit: 6 months.   Earlie Server, MD, PhD Hematology Oncology [facility] at [facility] Pager- [phone] 08/03/2019

## 2019-08-03 NOTE — Progress Notes (Signed)
Pt here for follow up, no new problems reported at this time.

## 2020-02-02 ENCOUNTER — Other Ambulatory Visit: Payer: Self-pay

## 2020-02-02 ENCOUNTER — Inpatient Hospital Stay: Payer: BC Managed Care – PPO | Attending: Oncology | Admitting: Oncology

## 2020-02-02 ENCOUNTER — Inpatient Hospital Stay: Payer: BC Managed Care – PPO

## 2020-02-02 ENCOUNTER — Encounter: Payer: Self-pay | Admitting: Oncology

## 2020-02-02 DIAGNOSIS — E079 Disorder of thyroid, unspecified: Secondary | ICD-10-CM | POA: Diagnosis not present

## 2020-02-02 NOTE — Progress Notes (Signed)
Patient does not offer any problems today.  

## 2020-02-02 NOTE — Progress Notes (Signed)
Hematology/Oncology follow up note [facility] Telephone:(336) [phone] Fax:(336) [phone]   Patient Care Team: Lynnea Ferrier, MD as PCP - General (Internal Medicine)  REFERRING PROVIDER: Lynnea Ferrier, MD REASON FOR VISIT Follow up for treatment of iron overload and hemochromatosis.   HISTORY OF PRESENTING ILLNESS:  Anna Mcgrath is a  59 y.o.  female with PMH listed below who was referred to me for evaluation of hemochromatosis.  Patient recent had lab work up done at [facility] office.  Reviewed lab report from [facility]  08/10/2018 Ferritin 446, wbc 6.7, Hemoglobin 13.8, MCV 99, normal differential, TSH 1.56, Creatinine 0.79,  She had a repeat test done on 08/14/2018  ferritin 580, serum iron 178, iron saturation 63% TIBC 284.  B12 496, Folate 16.9.  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  She reports chronic fatigue and also hand joint pain from chronic arthritis.   Denies family history of hemochromatosis. Her parents are deceased. Patient feels that " my parents may have passed one hemochromatosis gene to me".   INTERVAL HISTORY Anna Mcgrath is a 59 y.o. 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  Patient reports feeling well today.  No new complaints. She endorses drinking some beer a few weeks ago for celebration. She has not had any phlebotomy recently. Denies any skin darkening, skin rash, chest pain, abdominal pain, or shortness of breath .   Review of Systems  Constitutional: Negative for chills, fever, malaise/fatigue and weight loss.  HENT: Negative for sore throat.   Eyes: Negative for redness.  Respiratory: Negative for cough, shortness of breath and wheezing.   Cardiovascular: Negative for chest pain, palpitations and leg swelling.  Gastrointestinal: Negative for abdominal pain, blood in stool, heartburn, nausea and  vomiting.  Genitourinary: Negative for dysuria.  Musculoskeletal: Negative for myalgias.  Skin: Negative for rash.  Neurological: Negative for dizziness, tingling and tremors.  Endo/Heme/Allergies: Does not bruise/bleed easily.  Psychiatric/Behavioral: Negative for hallucinations.    MEDICAL HISTORY:  Past Medical History:  Diagnosis Date  . Asthma   . Thyroid disease     SURGICAL HISTORY: Past Surgical History:  Procedure Laterality Date  . APPENDECTOMY    . BREAST BIOPSY Left 01/31/2016   Fibrocystic changes  . BREAST CYST ASPIRATION Left 01/19/2016  . HEMORRHOID SURGERY  1988    SOCIAL HISTORY: Social History   Socioeconomic History  . Marital status: Married    Spouse name: Jonny Ruiz   . Number of children: 2  . Years of education: Not on file  . Highest education level: Not on file  Occupational History  . Not on file  Tobacco Use  . Smoking status: Never Smoker  . Smokeless tobacco: Never Used  Substance and Sexual Activity  . Alcohol use: Not Currently  . Drug use: Never  . Sexual activity: Not on file  Other Topics Concern  . Not on file  Social History Narrative  . Not on file   Social Determinants of Health   Financial Resource Strain:   . Difficulty of Paying Living Expenses: Not on file  Food Insecurity:   . Worried About Programme researcher, broadcasting/film/video in the Last Year: Not on file  . Ran Out of Food in the Last Year: Not on file  Transportation Needs:   . Lack of Transportation (Medical): Not on file  . Lack of Transportation (Non-Medical): Not on  file  Physical Activity:   . Days of Exercise per Week: Not on file  . Minutes of Exercise per Session: Not on file  Stress:   . Feeling of Stress : Not on file  Social Connections:   . Frequency of Communication with Friends and Family: Not on file  . Frequency of Social Gatherings with Friends and Family: Not on file  . Attends Religious Services: Not on file  . Active Member of Clubs or Organizations: Not  on file  . Attends Banker Meetings: Not on file  . Marital Status: Not on file  Intimate Partner Violence:   . Fear of Current or Ex-Partner: Not on file  . Emotionally Abused: Not on file  . Physically Abused: Not on file  . Sexually Abused: Not on file    FAMILY HISTORY: Family History  Problem Relation Age of Onset  . Non-Hodgkin's lymphoma Brother   . Cancer Maternal Grandfather   . Breast cancer Neg Hx     ALLERGIES:  is allergic to azithromycin; gatifloxacin; and penicillin g.  MEDICATIONS:  Current Outpatient Medications  Medication Sig Dispense Refill  . budesonide (PULMICORT FLEXHALER) 180 MCG/ACT inhaler Inhale into the lungs.    Marland Kitchen estradiol-norethindrone (MIMVEY) 1-0.5 MG tablet TAKE 1 TABLET BY MOUTH EVERY DAY    . levothyroxine (SYNTHROID, LEVOTHROID) 88 MCG tablet Take 88 mcg by mouth daily.    . pantoprazole (PROTONIX) 40 MG tablet Take 40 mg by mouth 2 (two) times daily.    Marland Kitchen PROAIR HFA 108 (90 Base) MCG/ACT inhaler TAKE 2 PUFFS BY MOUTH EVERY 4 TO 6 HOURS AS NEEDED    . BIOTIN PO Take by mouth.    . Cholecalciferol (VITAMIN D-1000 MAX ST) 1000 units tablet Take 1,000 Units by mouth daily.    . Flaxseed Oil (LINSEED OIL) OIL Use    . levalbuterol (XOPENEX) 1.25 MG/3ML nebulizer solution INHALE 3 MILLILITERS (1.25 MG) BY NEBULIZATION ROUTE 3 TIMES PER DAY AS NEEDED    . omeprazole (PRILOSEC) 20 MG capsule Take 20 mg by mouth 2 (two) times daily before a meal.   1  . omeprazole (PRILOSEC) 20 MG capsule TAKE 1 CAPSULE BY MOUTH TWICE A DAY    . predniSONE (STERAPRED UNI-PAK 21 TAB) 10 MG (21) TBPK tablet     . spironolactone (ALDACTONE) 50 MG tablet Take 50 mg by mouth 2 (two) times daily.  5   No current facility-administered medications for this visit.     PHYSICAL EXAMINATION: ECOG PERFORMANCE STATUS: 0 - Asymptomatic Vitals:   02/02/20 1408  BP: (!) 147/88  Pulse: 97  Temp: 97.9 F (36.6 C)  SpO2: (!) 18%   Filed Weights   02/02/20  1408  Weight: 162 lb 12.8 oz (73.8 kg)    Physical Exam Constitutional:      General: She is not in acute distress. HENT:     Head: Normocephalic and atraumatic.     Right Ear: External ear normal.     Left Ear: External ear normal.  Eyes:     General: No scleral icterus.    Pupils: Pupils are equal, round, and reactive to light.  Cardiovascular:     Rate and Rhythm: Normal rate and regular rhythm.     Heart sounds: Normal heart sounds.  Pulmonary:     Effort: Pulmonary effort is normal. No respiratory distress.     Breath sounds: No wheezing.  Abdominal:     General: Bowel sounds are normal. There is  no distension.     Palpations: Abdomen is soft. There is no mass.     Tenderness: There is no abdominal tenderness.  Musculoskeletal:        General: No deformity. Normal range of motion.     Cervical back: Normal range of motion and neck supple.  Skin:    General: Skin is warm and dry.     Findings: No erythema or rash.  Neurological:     Mental Status: She is alert and oriented to person, place, and time. Mental status is at baseline.     Cranial Nerves: No cranial nerve deficit.     Coordination: Coordination normal.  Psychiatric:        Mood and Affect: Mood normal.        Behavior: Behavior normal.        Thought Content: Thought content normal.      LABORATORY DATA:  [facility] labs reviewed.  11/27/2018 Hemoglobin 12, ferritin 23 01/25/2019 TSAT 31% ferritin 20 hemoglobin 13. 05/11/2019 hemoglobin 14.8, hematocrit 43.2, iron saturation 23%, ferritin 41 07/28/2019 hemoglobin 14.7, hematocrit 44.9 MCV 100 iron saturation 40%, ferritin 32 01/25/2020, hemoglobin 14.9, hematocrit 43.8, normal WBC and differential. Iron saturation 33, ferritin 39.  RADIOGRAPHIC STUDIES: I have personally reviewed the radiological images as listed and agreed with the findings in the report. 12/30/2018 ultrasound abdomen limited: Unremarkable right upper quadrant ultrasound. 01/18/2019 CT  abdomen pelvis with contrast showed moderate fecal loading throughout the length of his prominent cystitis not excluded.  No other acute abnormalities   ASSESSMENT & PLAN:  1. Hereditary hemochromatosis ([place])    # Labs done at [place] were reviewed and discussed with patient. Patient has stable iron level. She does not need any phlebotomy at this point.  Goal ferritin is 50 and she is at the target range. Discussed about avoiding alcohol.  Follow-up in the clinic in 6 months with repeat blood work for evaluation of additional need of phlebotomy. [facility] prescriptions were provided to patient. The patient knows to call the clinic with any problems questions or concerns.  Return of visit: 6 months.   Earlie Server, MD, PhD Hematology Oncology [facility] at [facility] Pager- [phone] 02/02/2020

## 2020-03-25 ENCOUNTER — Emergency Department: Payer: BC Managed Care – PPO

## 2020-03-25 ENCOUNTER — Other Ambulatory Visit: Payer: Self-pay

## 2020-03-25 ENCOUNTER — Encounter: Payer: Self-pay | Admitting: *Deleted

## 2020-03-25 DIAGNOSIS — E119 Type 2 diabetes mellitus without complications: Secondary | ICD-10-CM

## 2020-03-25 DIAGNOSIS — R002 Palpitations: Secondary | ICD-10-CM | POA: Diagnosis not present

## 2020-03-25 DIAGNOSIS — J45909 Unspecified asthma, uncomplicated: Secondary | ICD-10-CM | POA: Insufficient documentation

## 2020-03-25 DIAGNOSIS — Z79899 Other long term (current) drug therapy: Secondary | ICD-10-CM | POA: Insufficient documentation

## 2020-03-25 HISTORY — DX: Type 2 diabetes mellitus without complications: E11.9

## 2020-03-25 LAB — CBC
HCT: 42.8 % (ref 36.0–46.0)
Hemoglobin: 14.6 g/dL (ref 12.0–15.0)
MCH: 32.7 pg (ref 26.0–34.0)
MCHC: 34.1 g/dL (ref 30.0–36.0)
MCV: 96 fL (ref 80.0–100.0)
Platelets: 364 10*3/uL (ref 150–400)
RBC: 4.46 MIL/uL (ref 3.87–5.11)
RDW: 12.9 % (ref 11.5–15.5)
WBC: 10.4 10*3/uL (ref 4.0–10.5)
nRBC: 0 % (ref 0.0–0.2)

## 2020-03-25 LAB — COMPREHENSIVE METABOLIC PANEL
ALT: 20 U/L (ref 0–44)
AST: 19 U/L (ref 15–41)
Albumin: 4.4 g/dL (ref 3.5–5.0)
Alkaline Phosphatase: 63 U/L (ref 38–126)
Anion gap: 9 (ref 5–15)
BUN: 13 mg/dL (ref 6–20)
CO2: 24 mmol/L (ref 22–32)
Calcium: 9.1 mg/dL (ref 8.9–10.3)
Chloride: 105 mmol/L (ref 98–111)
Creatinine, Ser: 0.86 mg/dL (ref 0.44–1.00)
GFR calc Af Amer: >60 mL/min (ref >60)
GFR calc non Af Amer: >60 mL/min (ref >60)
Glucose, Bld: 184 mg/dL — ABNORMAL HIGH (ref 70–99)
Potassium: 3.9 mmol/L (ref 3.5–5.1)
Sodium: 138 mmol/L (ref 135–145)
Total Bilirubin: 0.7 mg/dL (ref 0.3–1.2)
Total Protein: 8 g/dL (ref 6.5–8.1)

## 2020-03-25 LAB — TROPONIN I (HIGH SENSITIVITY): Troponin I (High Sensitivity): 4 ng/L (ref <18)

## 2020-03-25 MED ORDER — SODIUM CHLORIDE 0.9% FLUSH: 3.0000 mL | Freq: Once | INTRAVENOUS | Status: DC

## 2020-03-25 NOTE — ED Triage Notes (Signed)
Pt c/o palpitations and tachycardia since 1800. Pt had 2nd pfizer vaccine for covid yesterday. Pt having no other symptoms.

## 2020-03-26 ENCOUNTER — Emergency Department
Admission: EM | Admit: 2020-03-26 | Discharge: 2020-03-26 | Disposition: A | Discharge status: Home / Self Care | Payer: BC Managed Care – PPO | Attending: Emergency Medicine | Admitting: Emergency Medicine

## 2020-03-26 DIAGNOSIS — R002 Palpitations: Secondary | ICD-10-CM

## 2020-03-26 LAB — TROPONIN I (HIGH SENSITIVITY): Troponin I (High Sensitivity): 3 ng/L (ref <18)

## 2020-03-26 LAB — TSH: TSH: 2.262 u[IU]/mL (ref 0.350–4.500)

## 2020-03-26 LAB — T4, FREE: Free T4: 1.08 ng/dL (ref 0.61–1.12)

## 2020-03-26 NOTE — Discharge Instructions (Addendum)
Return to the ER for recurrent or worsening symptoms, persistent vomiting, difficulty breathing or other concerns. 

## 2020-03-26 NOTE — ED Provider Notes (Signed)
[facility] Emergency Department Provider Note   ____________________________________________   First MD Initiated Contact with Patient 03/26/20 [phone]     (approximate)  I have reviewed the triage vital signs and the nursing notes.   HISTORY  Chief Complaint Palpitations    HPI Anna Mcgrath is a 59 y.o. female who presents to the ED from home with a chief complaint of palpitations.  Patient received her second Pfizer COVID-19 vaccination yesterday morning.  Had a brief episode of palpitations around 6 PM lasting seconds.  Describes racing heartbeat.  Denies associated fever, body aches, cough, chest pain, shortness of breath, abdominal pain, nausea, vomiting or dizziness. Currently just has a slight frontal headache.  Denies recent travel or trauma.       Past Medical History:  Diagnosis Date  . Asthma   . Thyroid disease     Patient Active Problem List   Diagnosis Date Noted  . Hemochromatosis 08/28/2018    Past Surgical History:  Procedure Laterality Date  . APPENDECTOMY    . BREAST BIOPSY Left 01/31/2016   Fibrocystic changes  . BREAST CYST ASPIRATION Left 01/19/2016  . HEMORRHOID SURGERY  1988    Prior to Admission medications   Medication Sig Start Date End Date Taking? Authorizing Provider  BIOTIN PO Take by mouth.    [provider]  budesonide (PULMICORT FLEXHALER) 180 MCG/ACT inhaler Inhale into the lungs.    [provider]  Cholecalciferol (VITAMIN D-1000 MAX ST) 1000 units tablet Take 1,000 Units by mouth daily.    [provider]  estradiol-norethindrone (MIMVEY) 1-0.5 MG tablet TAKE 1 TABLET BY MOUTH EVERY DAY 04/21/18   [provider]  Flaxseed Oil (LINSEED OIL) OIL Use    [provider]  levalbuterol (XOPENEX) 1.25 MG/3ML nebulizer solution INHALE 3 MILLILITERS (1.25 MG) BY NEBULIZATION ROUTE 3 TIMES PER DAY AS NEEDED 03/23/19   [provider]  levothyroxine (SYNTHROID,  LEVOTHROID) 88 MCG tablet Take 88 mcg by mouth daily. 08/08/15   [provider]  omeprazole (PRILOSEC) 20 MG capsule Take 20 mg by mouth 2 (two) times daily before a meal.  11/16/18   [provider]  omeprazole (PRILOSEC) 20 MG capsule TAKE 1 CAPSULE BY MOUTH TWICE A DAY 03/03/19   [provider]  pantoprazole (PROTONIX) 40 MG tablet Take 40 mg by mouth 2 (two) times daily.    [provider]  predniSONE (STERAPRED UNI-PAK 21 TAB) 10 MG (21) TBPK tablet  01/28/19   [provider]  PROAIR HFA 108 (90 Base) MCG/ACT inhaler TAKE 2 PUFFS BY MOUTH EVERY 4 TO 6 HOURS AS NEEDED 03/23/19   [provider]  spironolactone (ALDACTONE) 50 MG tablet Take 50 mg by mouth 2 (two) times daily. 08/04/18   [provider]    Allergies Azithromycin, Gatifloxacin, and Penicillin g  Family History  Problem Relation Age of Onset  . Non-Hodgkin's lymphoma Brother   . Cancer Maternal Grandfather   . Breast cancer Neg Hx     Social History Social History   Tobacco Use  . Smoking status: Never Smoker  . Smokeless tobacco: Never Used  Substance Use Topics  . Alcohol use: Not Currently  . Drug use: Never    Review of Systems  Constitutional: No fever/chills Eyes: No visual changes. ENT: No sore throat. Cardiovascular: Positive for palpitations.  Denies chest pain. Respiratory: Denies shortness of breath. Gastrointestinal: No abdominal pain.  No nausea, no vomiting.  No diarrhea.  No  constipation. Genitourinary: Negative for dysuria. Musculoskeletal: Negative for back pain. Skin: Negative for rash. Neurological: Negative for headaches, focal weakness or numbness.   ____________________________________________   PHYSICAL EXAM:  VITAL SIGNS: ED Triage Vitals  Enc Vitals Group     BP 03/25/20 2055 (!) 175/78     Pulse Rate 03/25/20 2055 (!) 107     Resp 03/25/20 2055 20     Temp 03/25/20 2055 98.7 F (37.1 C)     Temp Source  03/25/20 2055 Oral     SpO2 03/25/20 2055 99 %     Weight 03/25/20 2100 159 lb (72.1 kg)     Height 03/25/20 2100 5\' 7"  (1.702 m)     Head Circumference --      Peak Flow --      Pain Score 03/25/20 2059 0     Pain Loc --      Pain Edu? --      Excl. in [place]? --     Constitutional: Alert and oriented. Well appearing and in no acute distress. Eyes: Conjunctivae are normal. PERRL. EOMI. Head: Atraumatic. Nose: No congestion/rhinnorhea. Mouth/Throat: Mucous membranes are moist.  Oropharynx non-erythematous. Neck: No stridor.  No thyromegaly. Cardiovascular: Normal rate, regular rhythm. Grossly normal heart sounds.  Good peripheral circulation. Respiratory: Normal respiratory effort.  No retractions. Lungs CTAB. Gastrointestinal: Soft and nontender. No distention. No abdominal bruits. No CVA tenderness. Musculoskeletal: No lower extremity tenderness nor edema.  No joint effusions. Neurologic:  Normal speech and language. No gross focal neurologic deficits are appreciated. No gait instability. Skin:  Skin is warm, dry and intact. No rash noted. Psychiatric: Mood and affect are normal. Speech and behavior are normal.  ____________________________________________   LABS (all labs ordered are listed, but only abnormal results are displayed)  Labs Reviewed  COMPREHENSIVE METABOLIC PANEL - Abnormal; Notable for the following components:      Result Value   Glucose, Bld 184 (*)    All other components within normal limits  CBC  TSH  T4, FREE  TROPONIN I (HIGH SENSITIVITY)  TROPONIN I (HIGH SENSITIVITY)   ____________________________________________  EKG  ED ECG REPORT I, SUNG,JADE J, the attending physician, personally viewed and interpreted this ECG.   Date: 03/26/2020  EKG Time: 2123  Rate: 93  Rhythm: normal EKG, normal sinus rhythm  Axis: Normal  Intervals:none  ST&T Change: Nonspecific  ____________________________________________  RADIOLOGY  ED MD interpretation:  No acute cardiopulmonary process  Official radiology report(s): DG Chest 2 View  Result Date: 03/25/2020 CLINICAL DATA:  Palpitations and tachycardia. EXAM: CHEST - 2 VIEW COMPARISON:  January 25, 2015 FINDINGS: The heart size and mediastinal contours are within normal limits. Both lungs are clear. The visualized skeletal structures are unremarkable. IMPRESSION: No active cardiopulmonary disease. Electronically Signed   By: Virgina Norfolk M.D.   On: 03/25/2020 22:28    ____________________________________________   PROCEDURES  Procedure(s) performed (including Critical Care):  Procedures   ____________________________________________   INITIAL IMPRESSION / ASSESSMENT AND PLAN / ED COURSE  As part of my medical decision making, I reviewed the following data within the [facility] notes reviewed and incorporated, Labs reviewed, EKG interpreted, Old chart reviewed, Radiograph reviewed and Notes from prior ED visits     Keyaria Lawson was evaluated in Emergency Department on 03/26/2020 for the symptoms described in the history of present illness. She was evaluated in the context of the global COVID-19 pandemic, which necessitated consideration that the patient might be at risk  for infection with the SARS-CoV-2 virus that causes COVID-19. Institutional protocols and algorithms that pertain to the evaluation of patients at risk for COVID-19 are in a state of rapid change based on information released by regulatory bodies including the CDC and federal and state organizations. These policies and algorithms were followed during the patient's care in the ED.    60 year old female presenting with a brief episode of palpitations after receiving her second COVID-19 vaccination. Differential diagnosis includes, but is not limited to, ACS, aortic dissection, pulmonary embolism, cardiac tamponade, pneumothorax, pneumonia, pericarditis, myocarditis, GI-related causes including  esophagitis/gastritis, and musculoskeletal chest wall pain.    Work-up including 2 sets of troponins unremarkable.  Will add thyroid function as patient takes levothyroxine.  Will call patient at home if thyroid panel is significantly abnormal.  Otherwise symptoms have not recurred and patient feels back to baseline.  Strict return precautions given.  Patient verbalizes understanding agrees with plan of care.   Clinical Course as of Mar 26 616  Sun Mar 26, 2020  0421 Thyroid panel noted; unremarkable.   [JS]    Clinical Course User Index [JS] Irean Hong, MD     ____________________________________________   FINAL CLINICAL IMPRESSION(S) / ED DIAGNOSES  Final diagnoses:  Heart palpitations     ED Discharge Orders    None       Note:  This document was prepared using Dragon voice recognition software and may include unintentional dictation errors.   Irean Hong, MD 03/26/20 [phone]

## 2020-07-12 ENCOUNTER — Other Ambulatory Visit: Payer: Self-pay | Admitting: Obstetrics & Gynecology

## 2020-07-12 DIAGNOSIS — N6002 Solitary cyst of left breast: Secondary | ICD-10-CM

## 2020-07-12 DIAGNOSIS — Z87898 Personal history of other specified conditions: Secondary | ICD-10-CM

## 2020-07-28 ENCOUNTER — Other Ambulatory Visit: Payer: Self-pay

## 2020-07-28 ENCOUNTER — Ambulatory Visit
Admission: RE | Admit: 2020-07-28 | Discharge: 2020-07-28 | Disposition: A | Discharge status: Home / Self Care | Payer: BC Managed Care – PPO | Source: Ambulatory Visit | Attending: Obstetrics & Gynecology | Admitting: Obstetrics & Gynecology

## 2020-07-28 DIAGNOSIS — N6002 Solitary cyst of left breast: Secondary | ICD-10-CM

## 2020-07-28 DIAGNOSIS — Z87898 Personal history of other specified conditions: Secondary | ICD-10-CM | POA: Insufficient documentation

## 2020-08-08 ENCOUNTER — Encounter: Payer: Self-pay | Admitting: Oncology

## 2020-08-08 ENCOUNTER — Inpatient Hospital Stay: Payer: BC Managed Care – PPO | Attending: Oncology | Admitting: Oncology

## 2020-08-08 DIAGNOSIS — E1110 Type 2 diabetes mellitus with ketoacidosis without coma: Secondary | ICD-10-CM

## 2020-08-08 DIAGNOSIS — Z79899 Other long term (current) drug therapy: Secondary | ICD-10-CM | POA: Insufficient documentation

## 2020-08-08 NOTE — Progress Notes (Signed)
RN called pt and verified name and date of birth.  Pt scheduled for my chart visit at 215p.  Pt reports updated meds via mychart and stated was diagnosed with type II diabetes in April of this year.

## 2020-08-08 NOTE — Progress Notes (Signed)
HEMATOLOGY-ONCOLOGY TeleHEALTH VISIT PROGRESS NOTE  I connected with Anna Mcgrath on 08/08/20 at  2:15 PM EDT by video enabled telemedicine visit and verified that I am speaking with the correct person using two identifiers. I discussed the limitations, risks, security and privacy concerns of performing an evaluation and management service by telemedicine and the availability of in-person appointments. I also discussed with the patient that there may be a patient responsible charge related to this service. The patient expressed understanding and agreed to proceed.   Other persons participating in the visit and their role in the encounter:  None  Patient's location: Home  Provider's location: office Chief Complaint: hereditary hemochromatosis    INTERVAL HISTORY Anna Mcgrath is a 59 y.o. female who has above history reviewed by me today presents for follow up visit for management of  Problems and complaints are listed below:  She was recently diagnosed with diabetes.  No other new complaints.   Review of Systems  Constitutional: Negative for appetite change, chills, fatigue and fever.  HENT:   Negative for hearing loss and voice change.   Eyes: Negative for eye problems.  Respiratory: Negative for chest tightness and cough.   Cardiovascular: Negative for chest pain.  Gastrointestinal: Negative for abdominal distention, abdominal pain and blood in stool.  Endocrine: Negative for hot flashes.  Genitourinary: Negative for difficulty urinating and frequency.   Musculoskeletal: Negative for arthralgias.  Skin: Negative for itching and rash.  Neurological: Negative for extremity weakness.  Hematological: Negative for adenopathy.  Psychiatric/Behavioral: Negative for confusion.    Past Medical History:  Diagnosis Date  . Asthma   . Diabetes mellitus without complication (HCC) 03/25/2020   type II  . Thyroid disease    Past Surgical History:  Procedure Laterality Date  .  APPENDECTOMY    . BREAST BIOPSY Left 01/31/2016   Fibrocystic changes  . BREAST CYST ASPIRATION Left 01/19/2016  . BREAST CYST ASPIRATION Left 03/10/2018   neg  . HEMORRHOID SURGERY  1988    Family History  Problem Relation Age of Onset  . Non-Hodgkin's lymphoma Brother   . Cancer Maternal Grandfather   . Breast cancer Neg Hx     Social History   Socioeconomic History  . Marital status: Married    Spouse name: Jonny Ruiz   . Number of children: 2  . Years of education: Not on file  . Highest education level: Not on file  Occupational History  . Not on file  Tobacco Use  . Smoking status: Never Smoker  . Smokeless tobacco: Never Used  Vaping Use  . Vaping Use: Never used  Substance and Sexual Activity  . Alcohol use: Not Currently  . Drug use: Never  . Sexual activity: Not on file  Other Topics Concern  . Not on file  Social History Narrative  . Not on file   Social Determinants of Health   Financial Resource Strain:   . Difficulty of Paying Living Expenses: Not on file  Food Insecurity:   . Worried About Programme researcher, broadcasting/film/video in the Last Year: Not on file  . Ran Out of Food in the Last Year: Not on file  Transportation Needs:   . Lack of Transportation (Medical): Not on file  . Lack of Transportation (Non-Medical): Not on file  Physical Activity:   . Days of Exercise per Week: Not on file  . Minutes of Exercise per Session: Not on file  Stress:   . Feeling of Stress : Not on file  Social Connections:   . Frequency of Communication with Friends and Family: Not on file  . Frequency of Social Gatherings with Friends and Family: Not on file  . Attends Religious Services: Not on file  . Active Member of Clubs or Organizations: Not on file  . Attends Banker Meetings: Not on file  . Marital Status: Not on file  Intimate Partner Violence:   . Fear of Current or Ex-Partner: Not on file  . Emotionally Abused: Not on file  . Physically Abused: Not on file  .  Sexually Abused: Not on file    Current Outpatient Medications on File Prior to Visit  Medication Sig Dispense Refill  . budesonide (PULMICORT FLEXHALER) 180 MCG/ACT inhaler Inhale into the lungs.    Marland Kitchen estradiol-norethindrone (MIMVEY) 1-0.5 MG tablet TAKE 1 TABLET BY MOUTH EVERY DAY    . levalbuterol (XOPENEX) 1.25 MG/3ML nebulizer solution INHALE 3 MILLILITERS (1.25 MG) BY NEBULIZATION ROUTE 3 TIMES PER DAY AS NEEDED    . levothyroxine (SYNTHROID, LEVOTHROID) 88 MCG tablet Take 88 mcg by mouth daily.    . pantoprazole (PROTONIX) 40 MG tablet Take 40 mg by mouth 2 (two) times daily.    Marland Kitchen PROAIR HFA 108 (90 Base) MCG/ACT inhaler TAKE 2 PUFFS BY MOUTH EVERY 4 TO 6 HOURS AS NEEDED     No current facility-administered medications on file prior to visit.    Allergies  Allergen Reactions  . Azithromycin Rash  . Gatifloxacin Rash  . Penicillin G Rash       Observations/Objective: Today's Vitals   08/08/20 1402  PainSc: 0-No pain   There is no height or weight on file to calculate BMI.  Physical Exam Neurological:     Mental Status: She is alert.     CBC    Component Value Date/Time   WBC 10.4 03/25/2020 2136   RBC 4.46 03/25/2020 2136   HGB 14.6 03/25/2020 2136   HCT 42.8 03/25/2020 2136   PLT 364 03/25/2020 2136   MCV 96.0 03/25/2020 2136   MCH 32.7 03/25/2020 2136   MCHC 34.1 03/25/2020 2136   RDW 12.9 03/25/2020 2136    CMP     Component Value Date/Time   NA 138 03/25/2020 2136   K 3.9 03/25/2020 2136   CL 105 03/25/2020 2136   CO2 24 03/25/2020 2136   GLUCOSE 184 (H) 03/25/2020 2136   BUN 13 03/25/2020 2136   CREATININE 0.86 03/25/2020 2136   CALCIUM 9.1 03/25/2020 2136   PROT 8.0 03/25/2020 2136   ALBUMIN 4.4 03/25/2020 2136   AST 19 03/25/2020 2136   ALT 20 03/25/2020 2136   ALKPHOS 63 03/25/2020 2136   BILITOT 0.7 03/25/2020 2136   GFRNONAA >60 03/25/2020 2136   GFRAA >60 03/25/2020 2136     Assessment and Plan: 1. Hereditary hemochromatosis (HCC)    2. 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  Results from [facility] was discussed.  LFT is normal.  Iron 117, TIBC 286, iron saturation 41, ferritin 91.  Will proceed with phlebotomy.  Repeat cbc iron tibc ferritin in 3 months, and repeat phlebotomy if ferritin is >50.  Patient will follow up in 6 months.   DM, maybe related to hemachromatosis. Follow up with PCP for DM management.  Will continue close monitor ferritin level and keep it below 50.   Follow Up Instructions:  6 months.   I discussed the assessment  and treatment plan with the patient. The patient was provided an opportunity to ask questions and all were answered. The patient agreed with the plan and demonstrated an understanding of the instructions.  The patient was advised to call back or seek an in-person evaluation if the symptoms worsen or if the condition fails to improve as anticipated.    Rickard Patience, MD 08/08/2020 8:27 PM

## 2020-08-11 ENCOUNTER — Inpatient Hospital Stay: Payer: BC Managed Care – PPO

## 2020-08-11 ENCOUNTER — Other Ambulatory Visit: Payer: Self-pay

## 2020-08-11 ENCOUNTER — Other Ambulatory Visit: Payer: Self-pay | Admitting: Oncology

## 2020-08-11 DIAGNOSIS — Z79899 Other long term (current) drug therapy: Secondary | ICD-10-CM | POA: Diagnosis not present

## 2020-08-11 MED ORDER — SODIUM CHLORIDE 0.9 % IV SOLN
Freq: Once | INTRAVENOUS | Status: AC
  Administered 2020-08-11: 300 mL via INTRAVENOUS
  Filled 2020-08-11: qty 250

## 2020-11-06 ENCOUNTER — Inpatient Hospital Stay: Payer: BC Managed Care – PPO

## 2021-02-05 ENCOUNTER — Encounter: Payer: Self-pay | Admitting: Oncology

## 2021-02-06 ENCOUNTER — Other Ambulatory Visit: Payer: BC Managed Care – PPO

## 2021-02-06 ENCOUNTER — Encounter: Payer: Self-pay | Admitting: Oncology

## 2021-02-06 ENCOUNTER — Other Ambulatory Visit: Payer: Self-pay

## 2021-02-06 ENCOUNTER — Inpatient Hospital Stay: Payer: BC Managed Care – PPO

## 2021-02-06 ENCOUNTER — Inpatient Hospital Stay: Payer: BC Managed Care – PPO | Attending: Oncology | Admitting: Oncology

## 2021-02-06 DIAGNOSIS — R1011 Right upper quadrant pain: Secondary | ICD-10-CM

## 2021-02-06 NOTE — Progress Notes (Signed)
Hematology/Oncology follow up note [facility] [phone] [phone]   Patient Care Team: Lynnea Ferrier, MD as PCP - General (Internal Medicine)  REFERRING PROVIDER: Lynnea Ferrier, MD REASON FOR VISIT Follow up for treatment of iron overload and hemochromatosis.   HISTORY OF PRESENTING ILLNESS:  Anna Mcgrath is a  60 y.o.  female with PMH listed below who was referred to me for evaluation of hemochromatosis.  Patient recent had lab work up done at [facility] office.  Reviewed lab report from [facility].  08/10/2018 Ferritin 446, wbc 6.7, Hemoglobin 13.8, MCV 99, normal differential, TSH 1.56, Creatinine 0.79,  She had a repeat test done on 08/14/2018  ferritin 580, serum iron 178, iron saturation 63% TIBC 284.  B12 496, Folate 16.9.  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  She reports chronic fatigue and also hand joint pain from chronic arthritis.   Denies family history of hemochromatosis. Her parents are deceased. Patient feels that " my parents may have passed one hemochromatosis gene to me".   INTERVAL HISTORY Anna Mcgrath is a 60 y.o. 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  Patient reports feeling well today.  No new complaints. Patient has had lab work done at [facility]. Patient reports some intermittent right upper quadrant pain.  No definitive associated factors.  Sometimes she feels nauseated after eating.  No other new complaints. .   Review of Systems  Constitutional: Negative for chills, fever, malaise/fatigue and weight loss.  HENT: Negative for sore throat.   Eyes: Negative for redness.  Respiratory: Negative for cough, shortness of breath and wheezing.   Cardiovascular: Negative for chest pain, palpitations and leg swelling.  Gastrointestinal: Negative for abdominal pain, blood in stool, heartburn,  nausea and vomiting.  Genitourinary: Negative for dysuria.  Musculoskeletal: Negative for myalgias.  Skin: Negative for rash.  Neurological: Negative for dizziness, tingling and tremors.  Endo/Heme/Allergies: Does not bruise/bleed easily.  Psychiatric/Behavioral: Negative for hallucinations.    MEDICAL HISTORY:  Past Medical History:  Diagnosis Date  . Asthma   . Diabetes mellitus without complication (HCC) 03/25/2020   type II  . Thyroid disease     SURGICAL HISTORY: Past Surgical History:  Procedure Laterality Date  . APPENDECTOMY    . BREAST BIOPSY Left 01/31/2016   Fibrocystic changes  . BREAST CYST ASPIRATION Left 01/19/2016  . BREAST CYST ASPIRATION Left 03/10/2018   neg  . HEMORRHOID SURGERY  1988    SOCIAL HISTORY: Social History   Socioeconomic History  . Marital status: Married    Spouse name: Jonny Ruiz   . Number of children: 2  . Years of education: Not on file  . Highest education level: Not on file  Occupational History  . Not on file  Tobacco Use  . Smoking status: Never Smoker  . Smokeless tobacco: Never Used  Vaping Use  . Vaping Use: Never used  Substance and Sexual Activity  . Alcohol use: Not Currently  . Drug use: Never  . Sexual activity: Not on file  Other Topics Concern  . Not on file  Social History Narrative  . Not on file   Social Determinants of Health   Financial Resource Strain: Not on file  Food Insecurity: Not on file  Transportation Needs: Not on file  Physical Activity: Not on file  Stress: Not on file  Social Connections: Not on file  Intimate Partner Violence: Not on file    FAMILY HISTORY: Family History  Problem Relation Age of Onset  . Non-Hodgkin's lymphoma Brother   . Cancer Maternal Grandfather   . Breast cancer Neg Hx     ALLERGIES:  is allergic to azithromycin, gatifloxacin, and penicillin g.  MEDICATIONS:  Current Outpatient Medications  Medication Sig Dispense Refill  . budesonide (PULMICORT  FLEXHALER) 180 MCG/ACT inhaler Inhale into the lungs.    Marland Kitchen estradiol-norethindrone (MIMVEY) 1-0.5 MG tablet TAKE 1 TABLET BY MOUTH EVERY DAY    . levalbuterol (XOPENEX) 1.25 MG/3ML nebulizer solution INHALE 3 MILLILITERS (1.25 MG) BY NEBULIZATION ROUTE 3 TIMES PER DAY AS NEEDED    . levothyroxine (SYNTHROID, LEVOTHROID) 88 MCG tablet Take 88 mcg by mouth daily.    . pantoprazole (PROTONIX) 40 MG tablet Take 40 mg by mouth 2 (two) times daily.    Marland Kitchen PROAIR HFA 108 (90 Base) MCG/ACT inhaler TAKE 2 PUFFS BY MOUTH EVERY 4 TO 6 HOURS AS NEEDED     No current facility-administered medications for this visit.     PHYSICAL EXAMINATION: ECOG PERFORMANCE STATUS: 0 - Asymptomatic Vitals:   02/06/21 1327  BP: 130/75  Pulse: 80  Temp: 98.8 F (37.1 C)  SpO2: 100%   Filed Weights   02/06/21 1327  Weight: 153 lb 5 oz (69.5 kg)    Physical Exam Constitutional:      General: She is not in acute distress. HENT:     Head: Normocephalic and atraumatic.     Right Ear: External ear normal.     Left Ear: External ear normal.  Eyes:     General: No scleral icterus.    Pupils: Pupils are equal, round, and reactive to light.  Cardiovascular:     Rate and Rhythm: Normal rate and regular rhythm.     Heart sounds: Normal heart sounds.  Pulmonary:     Effort: Pulmonary effort is normal. No respiratory distress.     Breath sounds: No wheezing.  Abdominal:     General: Bowel sounds are normal. There is no distension.     Palpations: Abdomen is soft. There is no mass.     Tenderness: There is no abdominal tenderness.  Musculoskeletal:        General: No deformity. Normal range of motion.     Cervical back: Normal range of motion and neck supple.  Skin:    General: Skin is warm and dry.     Findings: No erythema or rash.  Neurological:     Mental Status: She is alert and oriented to person, place, and time. Mental status is at baseline.     Cranial Nerves: No cranial nerve deficit.      Coordination: Coordination normal.  Psychiatric:        Mood and Affect: Mood normal.        Behavior: Behavior normal.        Thought Content: Thought content normal.      LABORATORY DATA:  [facility] labs reviewed.  11/27/2018 Hemoglobin 12, ferritin 23 01/25/2019 TSAT 31% ferritin 20 hemoglobin 13. 05/11/2019 hemoglobin 14.8, hematocrit 43.2, iron saturation 23%, ferritin 41 07/28/2019 hemoglobin 14.7, hematocrit 44.9 MCV 100 iron saturation 40%, ferritin 32 01/25/2020, hemoglobin 14.9, hematocrit 43.8, normal WBC and differential. Iron saturation 33, ferritin 39. 02/03/2021, creatinine 0.82, bilirubin 0.3, alkaline phosphatase 64, AST 15, ALT 12.  Hemoglobin 14.3, MCV 98 TIBC 275, ferritin 64, iron saturation 38.   RADIOGRAPHIC STUDIES: I have personally reviewed the radiological  images as listed and agreed with the findings in the report. 12/30/2018 ultrasound abdomen limited: Unremarkable right upper quadrant ultrasound. 01/18/2019 CT abdomen pelvis with contrast showed moderate fecal loading throughout the length of his prominent cystitis not excluded.  No other acute abnormalities   ASSESSMENT & PLAN:  1. Hereditary hemochromatosis (HCC)   2. Iron overload   3. Right upper quadrant abdominal pain    #Hereditary hemochromatosis/iron overload, Labs done at [facility] were reviewed and discussed with patient. Ferritin is 64, recommend patient to proceed with phlebotomy 300 cc x 1 today to keep ferritin close to the target range of less than 50. Avoid alcohol.  #Right upper quadrant discomfort, recommend ultrasound right upper quadrant for further evaluation. Follow-up in the clinic in 6 months with repeat blood work for evaluation of additional need of phlebotomy. [facility] prescriptions were provided to patient.  The patient knows to call the clinic with any problems questions or concerns.  Return of visit: 6 months.   Rickard Patience, MD, PhD Hematology Oncology [facility] Pager- [phone] 02/06/2021

## 2021-02-06 NOTE — Patient Instructions (Signed)

## 2021-02-06 NOTE — Progress Notes (Signed)
Pt tolerated 300 ml phlebotomy well. No complaints at time of discharge. Feeling well. VSS.

## 2021-02-06 NOTE — Progress Notes (Signed)
Pt here for follow up. Pt has new diagnosis of diabetes. Patient has occasional pain under right rib.

## 2021-02-09 ENCOUNTER — Other Ambulatory Visit: Payer: Self-pay

## 2021-02-09 ENCOUNTER — Ambulatory Visit
Admission: RE | Admit: 2021-02-09 | Discharge: 2021-02-09 | Disposition: A | Discharge status: Home / Self Care | Payer: BC Managed Care – PPO | Source: Ambulatory Visit | Attending: Oncology | Admitting: Oncology

## 2021-02-09 DIAGNOSIS — R1011 Right upper quadrant pain: Secondary | ICD-10-CM | POA: Insufficient documentation

## 2021-02-12 ENCOUNTER — Encounter: Payer: Self-pay | Admitting: Oncology

## 2021-07-27 ENCOUNTER — Other Ambulatory Visit: Payer: Self-pay | Admitting: Internal Medicine

## 2021-07-27 DIAGNOSIS — Z1231 Encounter for screening mammogram for malignant neoplasm of breast: Secondary | ICD-10-CM

## 2021-08-06 ENCOUNTER — Ambulatory Visit: Payer: BC Managed Care – PPO | Admitting: Oncology

## 2021-08-10 ENCOUNTER — Encounter: Payer: Self-pay | Admitting: Oncology

## 2021-08-12 IMAGING — US US ABDOMEN LIMITED RUQ/ASCITES
1 series · 14 of 25 positions shown · non-contrast
Comparison: None.

CLINICAL DATA: Right upper quadrant pain x1 month

EXAM:
ULTRASOUND ABDOMEN LIMITED RIGHT UPPER QUADRANT

[Series 1: us abdomen limited ruq (liver/gb) · 14 of 45 slices shown]
[im 1/45]
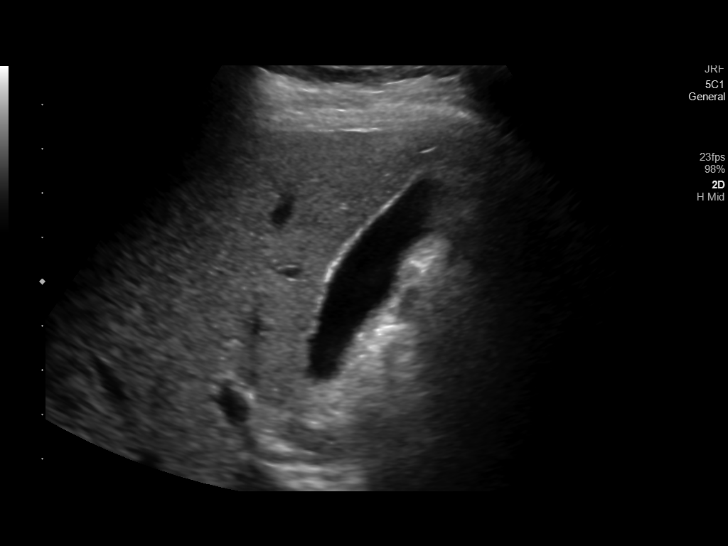
[im 4/45]
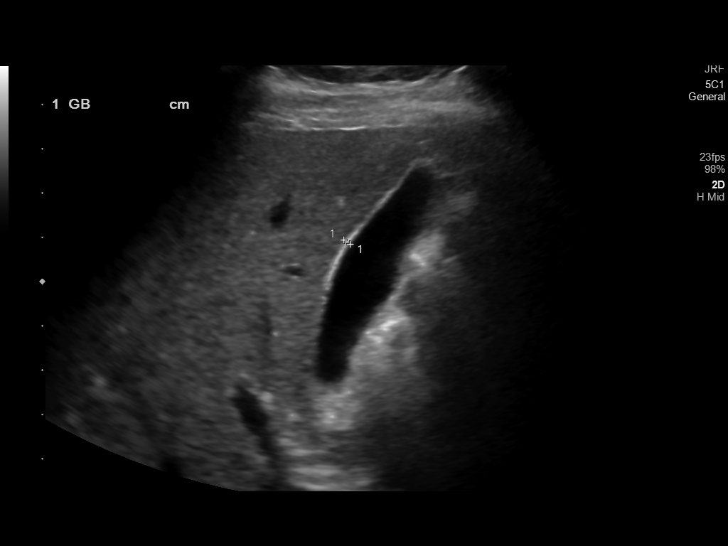
[im 8/45]
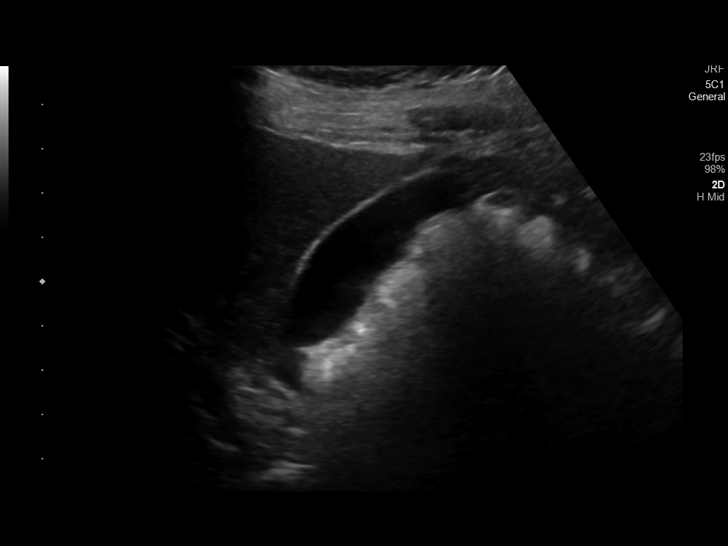
[im 12/45]
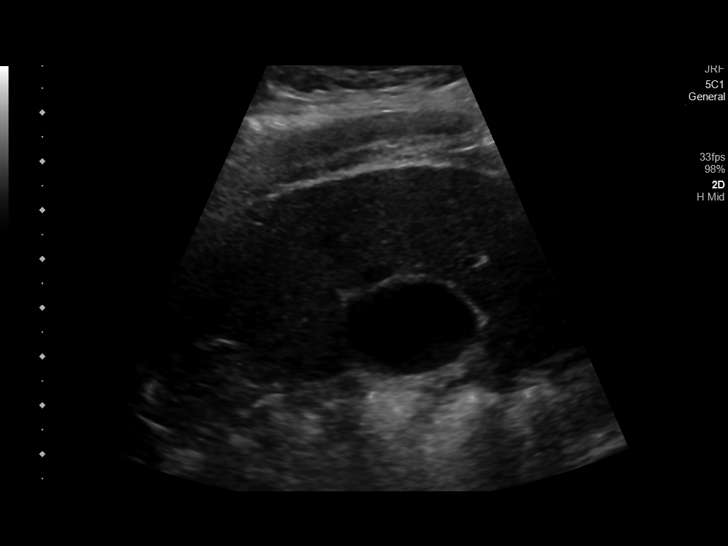
[im 15/45]
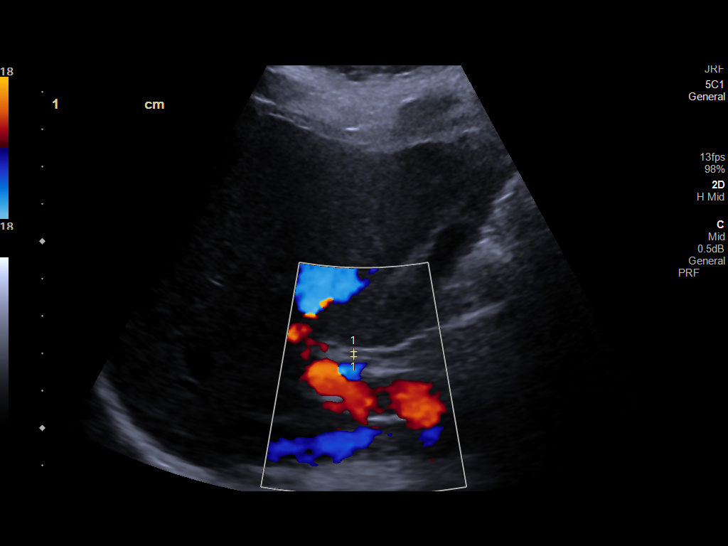
[im 17/45]
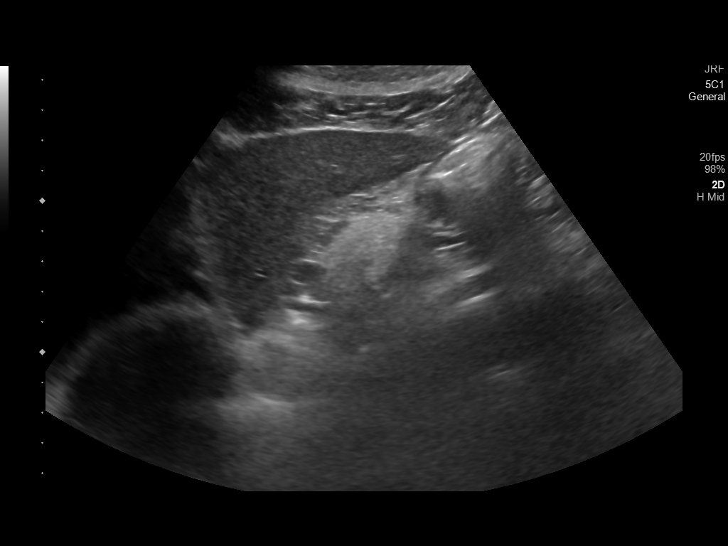
[im 21/45]
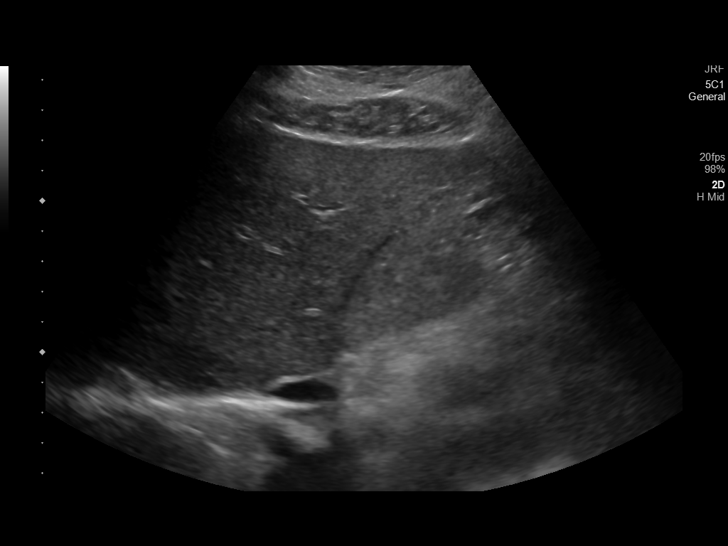
[im 24/45]
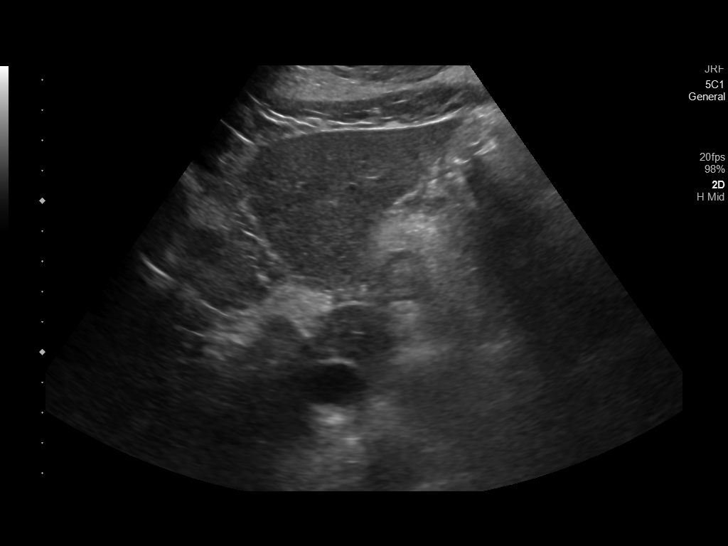
[im 28/45]
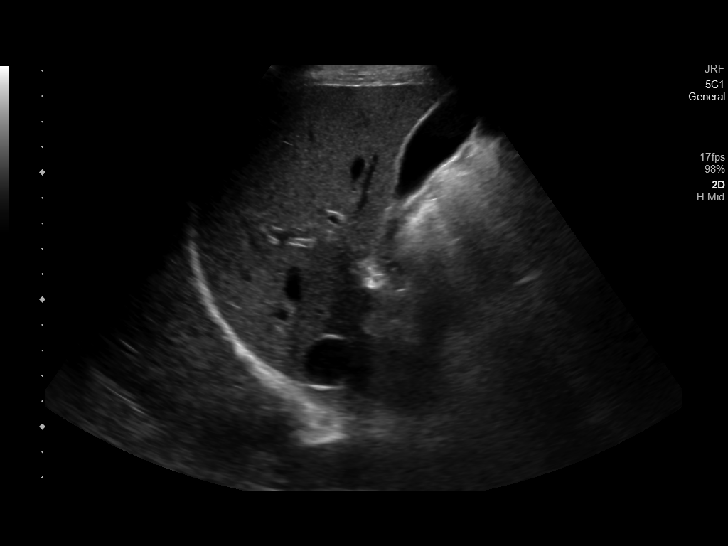
[im 30/45]
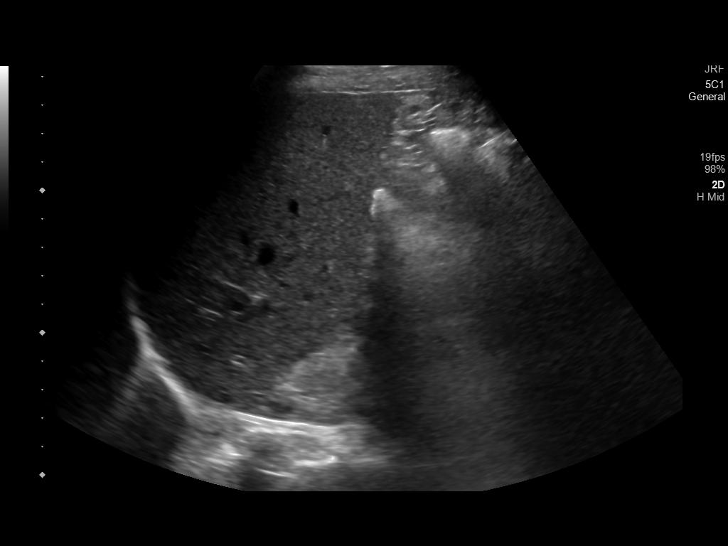
[im 34/45]
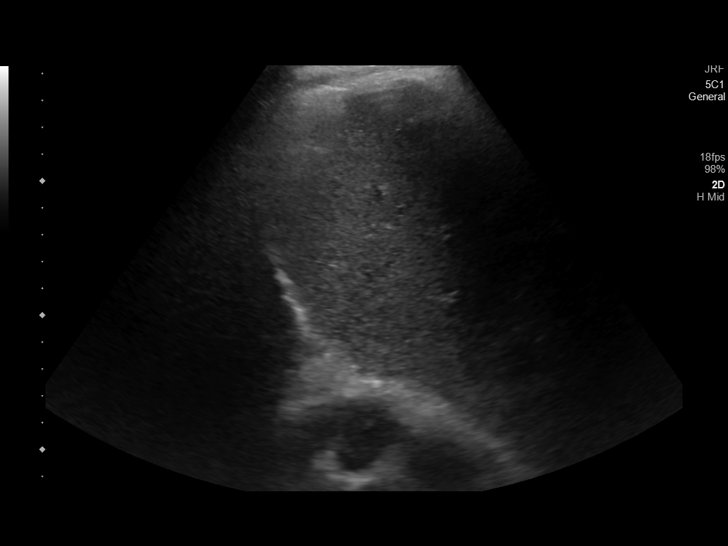
[im 37/45]
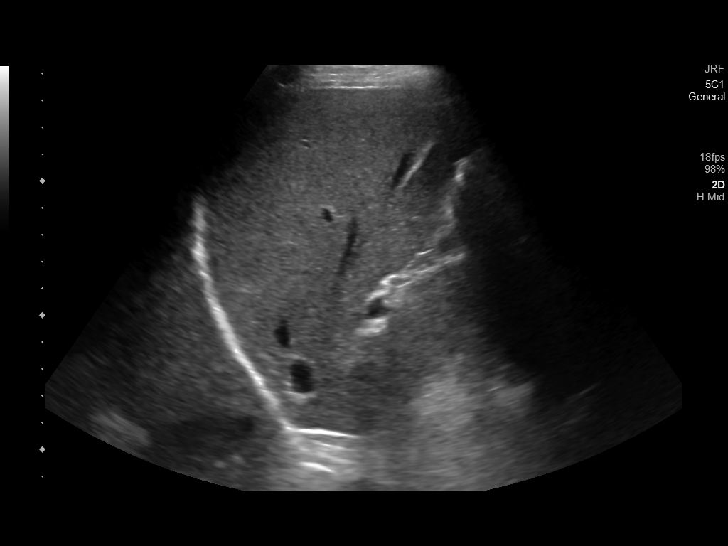
[im 41/45]
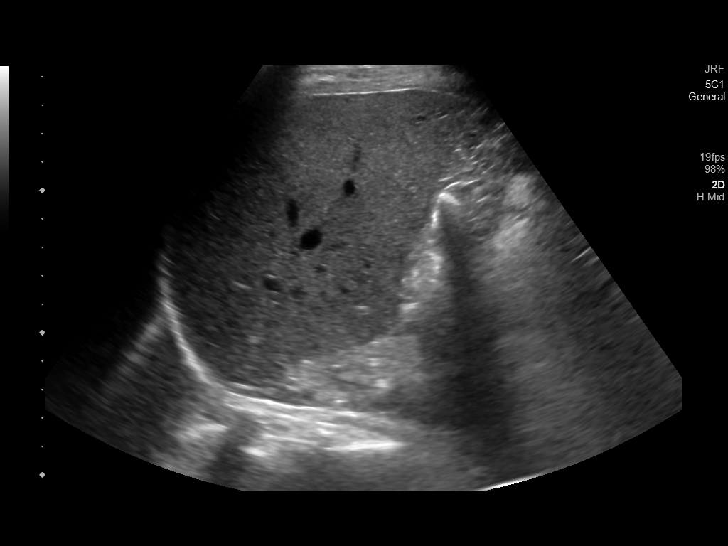
[im 45/45]
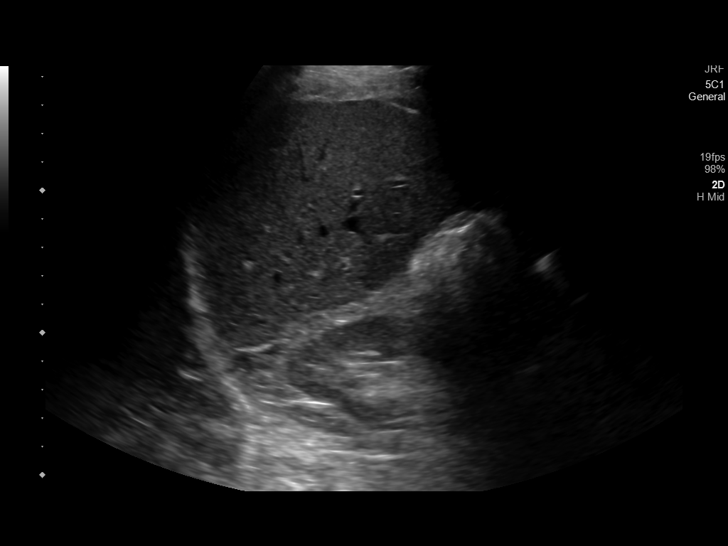

[14 of 25 positions shown; findings below may reference images not displayed]

FINDINGS: Gallbladder:

No gallstones or wall thickening visualized. No sonographic Murphy
sign noted by sonographer.

Common bile duct:

Diameter: 1.3 mm

Liver:

No focal lesion identified. Within normal limits in parenchymal
echogenicity. Portal vein is patent on color Doppler imaging with
normal direction of blood flow towards the liver.

Other: None.
IMPRESSION: Negative right upper quadrant ultrasound.

## 2021-08-13 ENCOUNTER — Inpatient Hospital Stay: Payer: BC Managed Care – PPO

## 2021-08-13 ENCOUNTER — Inpatient Hospital Stay: Payer: BC Managed Care – PPO | Attending: Oncology | Admitting: Oncology

## 2021-08-13 ENCOUNTER — Encounter: Payer: Self-pay | Admitting: Oncology

## 2021-08-13 NOTE — Progress Notes (Signed)
Pt here for follow up. No new concerns voiced.   

## 2021-08-13 NOTE — Progress Notes (Signed)
Hematology/Oncology follow up note [facility] Telephone:(336) [phone] Fax:(336) [phone]   Patient Care Team: Lynnea Ferrier, MD as PCP - General (Internal Medicine)  REFERRING PROVIDER: Lynnea Ferrier, MD REASON FOR VISIT Follow up for treatment of iron overload and hemochromatosis.   HISTORY OF PRESENTING ILLNESS:  Anna Mcgrath is a  60 y.o.  female with PMH listed below who was referred to me for evaluation of hemochromatosis.  Patient recent had lab work up done at [facility] office.  Reviewed lab report from [facility].  08/10/2018 Ferritin 446, wbc 6.7, Hemoglobin 13.8, MCV 99, normal differential, TSH 1.56, Creatinine 0.79,  She had a repeat test done on 08/14/2018  ferritin 580, serum iron 178, iron saturation 63% TIBC 284.  B12 496, Folate 16.9.  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  She reports chronic fatigue and also hand joint pain from chronic arthritis.   Denies family history of hemochromatosis. Her parents are deceased. Patient feels that " my parents may have passed one hemochromatosis gene to me".   INTERVAL HISTORY Anna Mcgrath is a 60 y.o. 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  Patient has had lab work done at [facility]. RUQ pain has resolved.  She drinks alcohol twice per week.  No new complaints.  .   Review of Systems  Constitutional:  Negative for chills, fever, malaise/fatigue and weight loss.  HENT:  Negative for sore throat.   Eyes:  Negative for redness.  Respiratory:  Negative for cough, shortness of breath and wheezing.   Cardiovascular:  Negative for chest pain, palpitations and leg swelling.  Gastrointestinal:  Negative for abdominal pain, blood in stool, heartburn, nausea and vomiting.  Genitourinary:  Negative for dysuria.  Musculoskeletal:  Negative for myalgias.  Skin:  Negative for rash.   Neurological:  Negative for dizziness, tingling and tremors.  Endo/Heme/Allergies:  Does not bruise/bleed easily.  Psychiatric/Behavioral:  Negative for hallucinations.    MEDICAL HISTORY:  Past Medical History:  Diagnosis Date   Asthma    Diabetes mellitus without complication (HCC) 03/25/2020   type II   Thyroid disease     SURGICAL HISTORY: Past Surgical History:  Procedure Laterality Date   APPENDECTOMY     BREAST BIOPSY Left 01/31/2016   Fibrocystic changes   BREAST CYST ASPIRATION Left 01/19/2016   BREAST CYST ASPIRATION Left 03/10/2018   neg   HEMORRHOID SURGERY  1988    SOCIAL HISTORY: Social History   Socioeconomic History   Marital status: Married    Spouse name: [name]    Number of children: 2   Years of education: Not on file   Highest education level: Not on file  Occupational History   Not on file  Tobacco Use   Smoking status: Never   Smokeless tobacco: Never  Vaping Use   Vaping Use: Never used  Substance and Sexual Activity   Alcohol use: Not Currently   Drug use: Never   Sexual activity: Not on file  Other Topics Concern   Not on file  Social History Narrative   Not on file   Social Determinants of Health   Financial Resource Strain: Not on file  Food Insecurity: Not on file  Transportation Needs: Not on file  Physical Activity: Not on file  Stress: Not on file  Social Connections: Not on file  Intimate Partner Violence: Not on file  FAMILY HISTORY: Family History  Problem Relation Age of Onset   Non-Hodgkin's lymphoma Brother    Cancer Maternal Grandfather    Breast cancer Neg Hx     ALLERGIES:  is allergic to azithromycin, gatifloxacin, and penicillin g.  MEDICATIONS:  Current Outpatient Medications  Medication Sig Dispense Refill   budesonide (PULMICORT) 180 MCG/ACT inhaler Inhale into the lungs.     cholecalciferol (VITAMIN D3) 25 MCG (1000 UNIT) tablet Take 1,000 Units by mouth daily.     estradiol-norethindrone  (ACTIVELLA) 1-0.5 MG tablet TAKE 1 TABLET BY MOUTH EVERY DAY     levalbuterol (XOPENEX) 1.25 MG/3ML nebulizer solution INHALE 3 MILLILITERS (1.25 MG) BY NEBULIZATION ROUTE 3 TIMES PER DAY AS NEEDED     levothyroxine (SYNTHROID, LEVOTHROID) 88 MCG tablet Take 88 mcg by mouth daily.     pantoprazole (PROTONIX) 40 MG tablet Take 40 mg by mouth 2 (two) times daily.     PROAIR HFA 108 (90 Base) MCG/ACT inhaler TAKE 2 PUFFS BY MOUTH EVERY 4 TO 6 HOURS AS NEEDED     triamcinolone (NASACORT ALLERGY 24HR) 55 MCG/ACT AERO nasal inhaler Place 2 sprays into the nose daily.     No current facility-administered medications for this visit.     PHYSICAL EXAMINATION: ECOG PERFORMANCE STATUS: 0 - Asymptomatic Vitals:   08/13/21 1306  BP: (!) 143/83  Pulse: 87  Resp: 18  Temp: 98.1 F (36.7 C)   Filed Weights   08/13/21 1306  Weight: 151 lb (68.5 kg)    Physical Exam Constitutional:      General: She is not in acute distress. HENT:     Head: Normocephalic and atraumatic.     Right Ear: External ear normal.     Left Ear: External ear normal.  Eyes:     General: No scleral icterus.    Pupils: Pupils are equal, round, and reactive to light.  Cardiovascular:     Rate and Rhythm: Normal rate and regular rhythm.     Heart sounds: Normal heart sounds.  Pulmonary:     Effort: Pulmonary effort is normal. No respiratory distress.     Breath sounds: No wheezing.  Abdominal:     General: Bowel sounds are normal. There is no distension.     Palpations: Abdomen is soft. There is no mass.     Tenderness: There is no abdominal tenderness.  Musculoskeletal:        General: No deformity. Normal range of motion.     Cervical back: Normal range of motion and neck supple.  Skin:    General: Skin is warm and dry.     Findings: No erythema or rash.  Neurological:     Mental Status: She is alert and oriented to person, place, and time. Mental status is at baseline.     Cranial Nerves: No cranial nerve  deficit.     Coordination: Coordination normal.  Psychiatric:        Mood and Affect: Mood normal.        Behavior: Behavior normal.        Thought Content: Thought content normal.     LABORATORY DATA:  [facility] labs reviewed.  11/27/2018 Hemoglobin 12, ferritin 23 01/25/2019 TSAT 31% ferritin 20 hemoglobin 13. 05/11/2019 hemoglobin 14.8, hematocrit 43.2, iron saturation 23%, ferritin 41 07/28/2019 hemoglobin 14.7, hematocrit 44.9 MCV 100 iron saturation 40%, ferritin 32 01/25/2020, hemoglobin 14.9, hematocrit 43.8, normal WBC and differential. Iron saturation 33, ferritin 39. 02/03/2021, creatinine 0.82, bilirubin 0.3, alkaline phosphatase 64,  AST 15, ALT 12.  Hemoglobin 14.3, MCV 98,  TIBC 275, ferritin 64, iron saturation 38. 08/09/2021 Hb 14.5, MCV 98.normal LFT. TIBC 50, ferritin 70  RADIOGRAPHIC STUDIES: I have personally reviewed the radiological images as listed and agreed with the findings in the report. 12/30/2018 ultrasound abdomen limited: Unremarkable right upper quadrant ultrasound. 01/18/2019 CT abdomen pelvis with contrast showed moderate fecal loading throughout the length of his prominent cystitis not excluded.  No other acute abnormalities   ASSESSMENT & PLAN:  1. Hereditary hemochromatosis [facility])    #Hereditary hemochromatosis/iron overload, Labs done at [facility] were reviewed and discussed with patient. Ferritin is 70, recommend patient to proceed with phlebotomy 300 cc x 1 today And repeat in 3 weeks for another phlebotomy. Goal of ferritin to be less than 50 Avoid alcohol.    Follow-up in the clinic in 6 months with repeat blood work for evaluation of additional need of phlebotomy. [facility] prescriptions were provided to patient.  The patient knows to call the clinic with any problems questions or concerns.  Return of visit: 6 months.   Rickard Patience, MD, PhD Hematology Oncology [facility]  08/13/2021

## 2021-08-13 NOTE — Progress Notes (Signed)
Therapeutic phlebotomy performed in left AC using 20g angiocath. removed. Pt tolerated procedure well. Oral hydration provided. Vital signs stable at discharge.

## 2021-08-21 ENCOUNTER — Other Ambulatory Visit: Payer: Self-pay

## 2021-08-21 ENCOUNTER — Ambulatory Visit
Admission: RE | Admit: 2021-08-21 | Discharge: 2021-08-21 | Disposition: A | Discharge status: Home / Self Care | Payer: BC Managed Care – PPO | Source: Ambulatory Visit | Attending: Internal Medicine | Admitting: Internal Medicine

## 2021-08-21 DIAGNOSIS — Z1231 Encounter for screening mammogram for malignant neoplasm of breast: Secondary | ICD-10-CM | POA: Diagnosis not present

## 2021-08-27 ENCOUNTER — Other Ambulatory Visit: Payer: Self-pay | Admitting: Internal Medicine

## 2021-08-27 DIAGNOSIS — R928 Other abnormal and inconclusive findings on diagnostic imaging of breast: Secondary | ICD-10-CM

## 2021-08-27 DIAGNOSIS — N6489 Other specified disorders of breast: Secondary | ICD-10-CM

## 2021-08-31 ENCOUNTER — Ambulatory Visit
Admission: RE | Admit: 2021-08-31 | Discharge: 2021-08-31 | Disposition: A | Discharge status: Home / Self Care | Payer: BC Managed Care – PPO | Source: Ambulatory Visit | Attending: Internal Medicine | Admitting: Internal Medicine

## 2021-08-31 ENCOUNTER — Other Ambulatory Visit: Payer: Self-pay

## 2021-08-31 DIAGNOSIS — R928 Other abnormal and inconclusive findings on diagnostic imaging of breast: Secondary | ICD-10-CM

## 2021-08-31 DIAGNOSIS — N6489 Other specified disorders of breast: Secondary | ICD-10-CM

## 2021-09-03 ENCOUNTER — Inpatient Hospital Stay: Payer: BC Managed Care – PPO | Attending: Oncology

## 2021-09-03 NOTE — Progress Notes (Signed)
Therapeutic phlebotomy performed. Removed 300 ml blood per MD order. Tolerated procedure well. Accepted a beverage. Denies any weakness. Discharged to home. VSS.

## 2021-09-04 ENCOUNTER — Other Ambulatory Visit: Payer: Self-pay | Admitting: Internal Medicine

## 2021-09-04 DIAGNOSIS — N6489 Other specified disorders of breast: Secondary | ICD-10-CM

## 2021-09-04 DIAGNOSIS — R928 Other abnormal and inconclusive findings on diagnostic imaging of breast: Secondary | ICD-10-CM

## 2021-09-04 DIAGNOSIS — R921 Mammographic calcification found on diagnostic imaging of breast: Secondary | ICD-10-CM

## 2021-09-07 ENCOUNTER — Ambulatory Visit
Admission: RE | Admit: 2021-09-07 | Discharge: 2021-09-07 | Disposition: A | Discharge status: Home / Self Care | Payer: BC Managed Care – PPO | Source: Ambulatory Visit | Attending: Internal Medicine | Admitting: Internal Medicine

## 2021-09-07 ENCOUNTER — Other Ambulatory Visit: Payer: Self-pay

## 2021-09-07 DIAGNOSIS — R928 Other abnormal and inconclusive findings on diagnostic imaging of breast: Secondary | ICD-10-CM

## 2021-09-07 DIAGNOSIS — N6489 Other specified disorders of breast: Secondary | ICD-10-CM | POA: Diagnosis present

## 2021-09-07 DIAGNOSIS — R921 Mammographic calcification found on diagnostic imaging of breast: Secondary | ICD-10-CM | POA: Diagnosis present

## 2021-09-07 HISTORY — PX: BREAST BIOPSY: SHX20

## 2021-09-10 LAB — SURGICAL PATHOLOGY

## 2022-02-11 ENCOUNTER — Encounter: Payer: Self-pay | Admitting: Family Medicine

## 2022-02-12 ENCOUNTER — Other Ambulatory Visit: Payer: Self-pay

## 2022-02-12 ENCOUNTER — Encounter: Payer: Self-pay | Admitting: Oncology

## 2022-02-12 ENCOUNTER — Inpatient Hospital Stay: Payer: BC Managed Care – PPO

## 2022-02-12 ENCOUNTER — Inpatient Hospital Stay: Payer: BC Managed Care – PPO | Attending: Oncology | Admitting: Oncology

## 2022-02-12 DIAGNOSIS — R7401 Elevation of levels of liver transaminase levels: Secondary | ICD-10-CM | POA: Diagnosis not present

## 2022-02-12 NOTE — Progress Notes (Signed)
Hematology/Oncology Progress note Telephone:(336) [phone] Fax:(336) [phone]      Patient Care Team: Lynnea FerrierKlein, Bert J III, MD as PCP - General (Internal Medicine)  REFERRING PROVIDER: Lynnea FerrierKlein, Bert J III, MD REASON FOR VISIT Follow up for treatment of iron overload and hemochromatosis.   HISTORY OF PRESENTING ILLNESS:  Anna Mcgrath is a  61 y.o.  female with PMH listed below who was referred to me for evaluation of hemochromatosis.  [facility]  Reviewed lab report from [facility]  08/10/2018 Ferritin 446, wbc 6.7, Hemoglobin 13.8, MCV 99, normal differential, TSH 1.56, Creatinine 0.79,  She had a repeat test done on 08/14/2018  ferritin 580, serum iron 178, iron saturation 63% TIBC 284.  B12 496, Folate 16.9.  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  She reports chronic fatigue and also hand joint pain from chronic arthritis.   Denies family history of hemochromatosis. Her parents are deceased. Patient feels that " my parents may have passed one hemochromatosis gene to me".   INTERVAL HISTORY Anna Mcgrath is a 61 y.o. 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  [facility]. Patient drinks alcohol occasionally. .   Review of Systems  Constitutional:  Negative for chills, fever, malaise/fatigue and weight loss.  HENT:  Negative for sore throat.   Eyes:  Negative for redness.  Respiratory:  Negative for cough, shortness of breath and wheezing.   Cardiovascular:  Negative for chest pain, palpitations and leg swelling.  Gastrointestinal:  Negative for abdominal pain, blood in stool, heartburn, nausea and vomiting.  Genitourinary:  Negative for dysuria.  Musculoskeletal:  Negative for myalgias.  Skin:  Negative for rash.  Neurological:  Negative for dizziness, tingling and tremors.   Endo/Heme/Allergies:  Does not bruise/bleed easily.  Psychiatric/Behavioral:  Negative for hallucinations.    MEDICAL HISTORY:  Past Medical History:  Diagnosis Date   Asthma    Diabetes mellitus without complication (HCC) 03/25/2020   type II   Thyroid disease     SURGICAL HISTORY: Past Surgical History:  Procedure Laterality Date   APPENDECTOMY     BREAST BIOPSY Left 01/31/2016   Fibrocystic changes   BREAST BIOPSY Left 09/07/2021   Stereo Bx, X-clip, path pending   BREAST CYST ASPIRATION Left 01/19/2016   BREAST CYST ASPIRATION Left 03/10/2018   neg   HEMORRHOID SURGERY  1988    SOCIAL HISTORY: Social History   Socioeconomic History   Marital status: Married    Spouse name: [name]    Number of children: 2   Years of education: Not on file   Highest education level: Not on file  Occupational History   Not on file  Tobacco Use   Smoking status: Never   Smokeless tobacco: Never  Vaping Use   Vaping Use: Never used  Substance and Sexual Activity   Alcohol use: Not Currently   Drug use: Never   Sexual activity: Not on file  Other Topics Concern   Not on file  Social History Narrative   Not on file   Social Determinants of Health   Financial Resource Strain: Not on file  Food Insecurity: Not on file  Transportation Needs: Not on file  Physical Activity: Not on file  Stress: Not on file  Social Connections: Not on file  Intimate Partner Violence: Not on file  FAMILY HISTORY: Family History  Problem Relation Age of Onset   Non-Hodgkin's lymphoma Brother    Cancer Maternal Grandfather    Breast cancer Neg Hx     ALLERGIES:  is allergic to azithromycin, gatifloxacin, and penicillin g.  MEDICATIONS:  Current Outpatient Medications  Medication Sig Dispense Refill   budesonide (PULMICORT) 180 MCG/ACT inhaler Inhale into the lungs.     cholecalciferol (VITAMIN D3) 25 MCG (1000 UNIT) tablet Take 1,000 Units by mouth daily.     estradiol-norethindrone  (ACTIVELLA) 1-0.5 MG tablet TAKE 1 TABLET BY MOUTH EVERY DAY     levothyroxine (SYNTHROID, LEVOTHROID) 88 MCG tablet Take 88 mcg by mouth daily.     pantoprazole (PROTONIX) 40 MG tablet Take 40 mg by mouth 2 (two) times daily.     triamcinolone (NASACORT) 55 MCG/ACT AERO nasal inhaler Place 2 sprays into the nose daily.     levalbuterol (XOPENEX) 1.25 MG/3ML nebulizer solution INHALE 3 MILLILITERS (1.25 MG) BY NEBULIZATION ROUTE 3 TIMES PER DAY AS NEEDED (Patient not taking: Reported on 02/12/2022)     PREMARIN vaginal cream PLEASE SEE ATTACHED FOR DETAILED DIRECTIONS     PROAIR HFA 108 (90 Base) MCG/ACT inhaler TAKE 2 PUFFS BY MOUTH EVERY 4 TO 6 HOURS AS NEEDED (Patient not taking: Reported on 02/12/2022)     No current facility-administered medications for this visit.     PHYSICAL EXAMINATION: ECOG PERFORMANCE STATUS: 0 - Asymptomatic Vitals:   02/12/22 1300  BP: (!) 155/84  Pulse: 76  Resp: 18  Temp: (!) 97.3 F (36.3 C)   Filed Weights   02/12/22 1300  Weight: 154 lb 3.2 oz (69.9 kg)    Physical Exam Constitutional:      General: She is not in acute distress. HENT:     Head: Normocephalic and atraumatic.     Right Ear: External ear normal.     Left Ear: External ear normal.  Eyes:     General: No scleral icterus.    Pupils: Pupils are equal, round, and reactive to light.  Cardiovascular:     Rate and Rhythm: Normal rate and regular rhythm.     Heart sounds: Normal heart sounds.  Pulmonary:     Effort: Pulmonary effort is normal. No respiratory distress.     Breath sounds: No wheezing.  Abdominal:     General: Bowel sounds are normal. There is no distension.     Palpations: Abdomen is soft. There is no mass.     Tenderness: There is no abdominal tenderness.  Musculoskeletal:        General: No deformity. Normal range of motion.     Cervical back: Normal range of motion and neck supple.  Skin:    General: Skin is warm and dry.     Findings: No erythema or rash.   Neurological:     Mental Status: She is alert and oriented to person, place, and time. Mental status is at baseline.     Cranial Nerves: No cranial nerve deficit.     Coordination: Coordination normal.  Psychiatric:        Mood and Affect: Mood normal.        Behavior: Behavior normal.        Thought Content: Thought content normal.     LABORATORY DATA:  [facility] labs reviewed.  11/27/2018 Hemoglobin 12, ferritin 23 01/25/2019 TSAT 31% ferritin 20 hemoglobin 13. 05/11/2019 hemoglobin 14.8, hematocrit 43.2, iron saturation 23%, ferritin 41 07/28/2019 hemoglobin 14.7, hematocrit 44.9 MCV 100 iron  saturation 40%, ferritin 32 01/25/2020, hemoglobin 14.9, hematocrit 43.8, normal WBC and differential. Iron saturation 33, ferritin 39. 02/03/2021, creatinine 0.82, bilirubin 0.3, alkaline phosphatase 64, AST 15, ALT 12.  Hemoglobin 14.3, MCV 98,  TIBC 275, ferritin 64, iron saturation 38. 08/09/2021 Hb 14.5, MCV 98.normal LFT. TIBC 50, ferritin 70,  02/11/2022 ALT 47, ferritin 48, iron saturation 40, TIBC 340. RADIOGRAPHIC STUDIES: I have personally reviewed the radiological images as listed and agreed with the findings in the report. 12/30/2018 ultrasound abdomen limited: Unremarkable right upper quadrant ultrasound. 01/18/2019 CT abdomen pelvis with contrast showed moderate fecal loading throughout the length of his prominent cystitis not excluded.  No other acute abnormalities   ASSESSMENT & PLAN:  1. Hereditary hemochromatosis ([place])   2. Elevated ALT measurement    #Hereditary hemochromatosis/iron overload, Labs done at [facility] were reviewed and discussed with patient. Ferritin is 48.  At goal. I will hold off phlebotomy at this point.  #Slightly elevated ALT, recommend patient to avoid alcohol.  Follow-up in the clinic in 6 months with repeat blood work for evaluation of additional need of phlebotomy. [facility] prescriptions were provided to patient.  The patient knows to call the clinic  with any problems questions or concerns.   Earlie Server, MD, PhD Hematology Oncology   02/12/2022

## 2022-02-12 NOTE — Progress Notes (Signed)
Pt here for follow up. No new concerns voiced.   

## 2022-02-12 NOTE — Progress Notes (Signed)
HCT 44.6 . No phlebotomy today per Dr Cathie Hoops.

## 2022-02-21 IMAGING — MG MM DIGITAL SCREENING BILAT W/ TOMO AND CAD
6 of 10 series · 6 of 30 positions shown · non-contrast
Comparison: Previous exam(s).

CLINICAL DATA: Screening. History of cysts.

EXAM:
DIGITAL SCREENING BILATERAL MAMMOGRAM WITH TOMOSYNTHESIS AND CAD
TECHNIQUE: Bilateral screening digital craniocaudal and mediolateral oblique
mammograms were obtained. Bilateral screening digital breast
tomosynthesis was performed. The images were evaluated with
computer-aided detection.

[R CC synth-2D (1 of 2)]
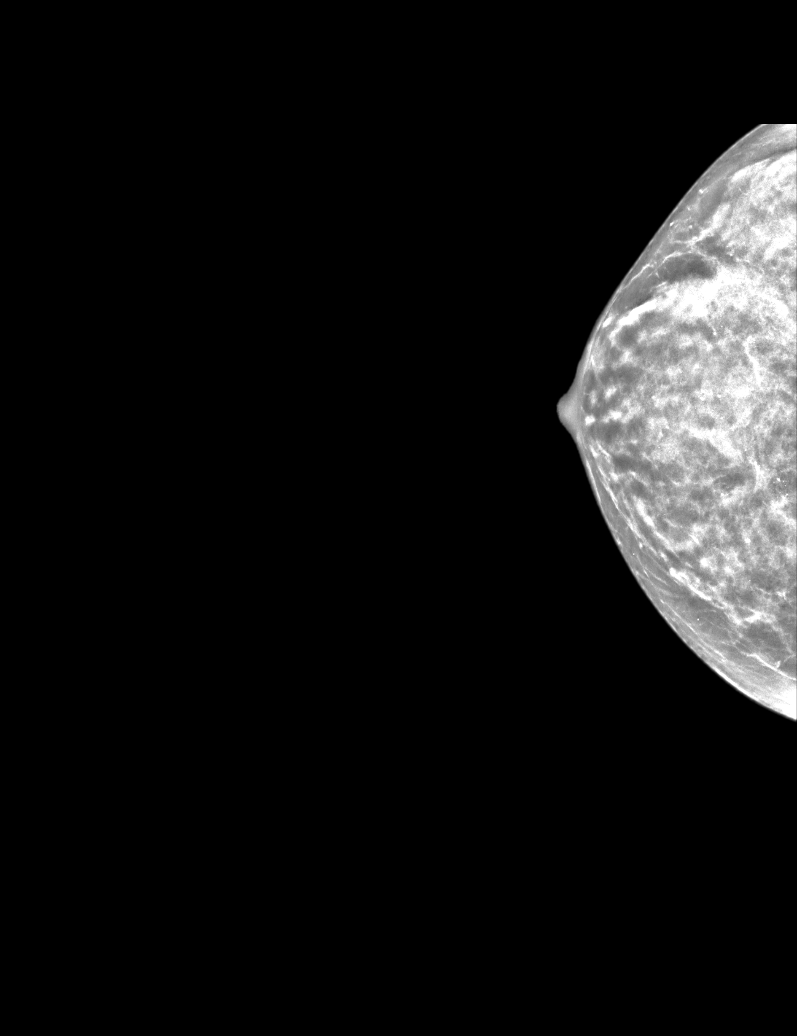

[R CC synth-2D (2 of 2)]
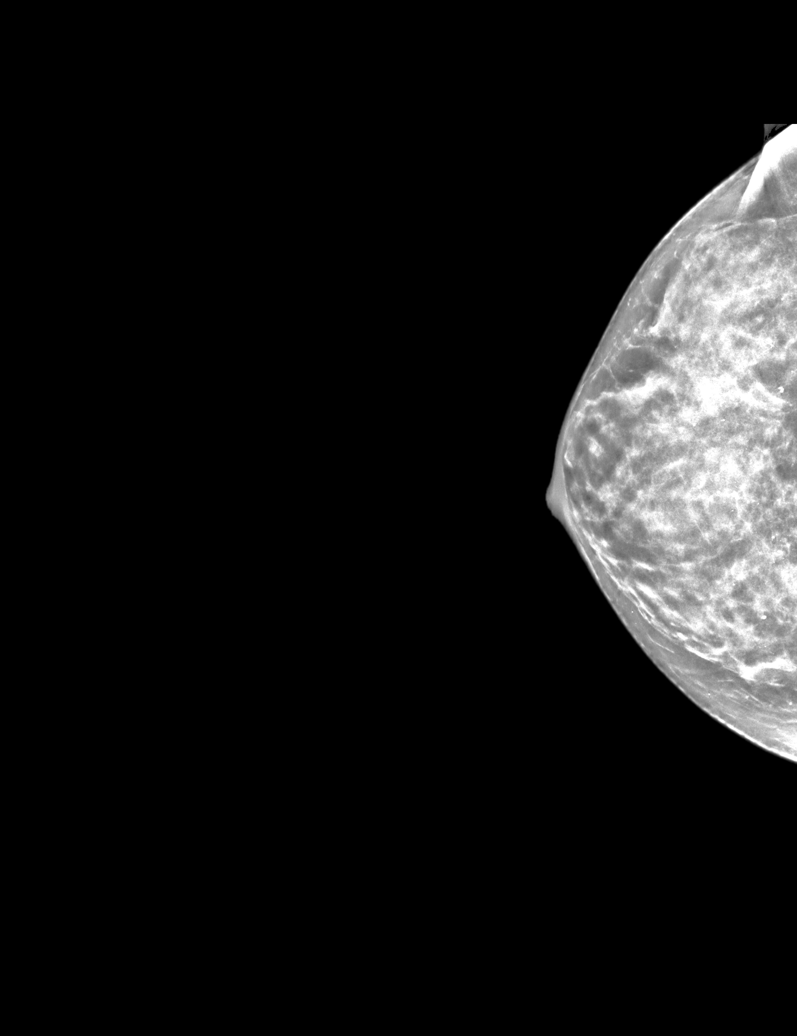

[R MLO synth-2D]
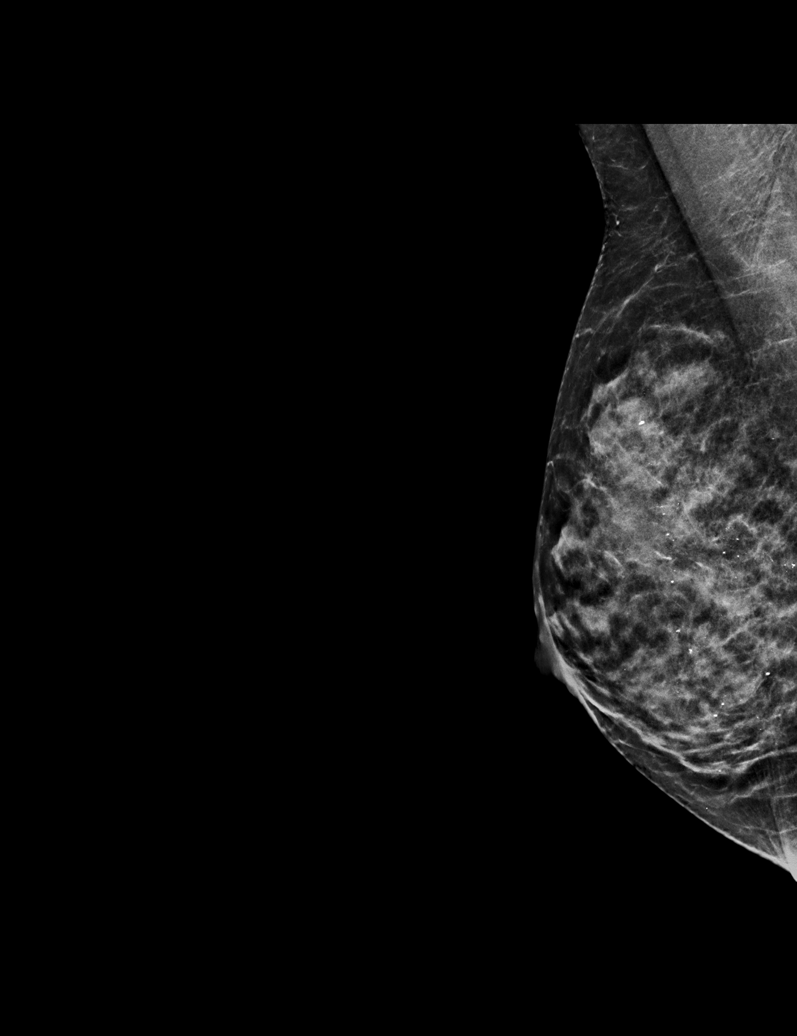

[L CC synth-2D]
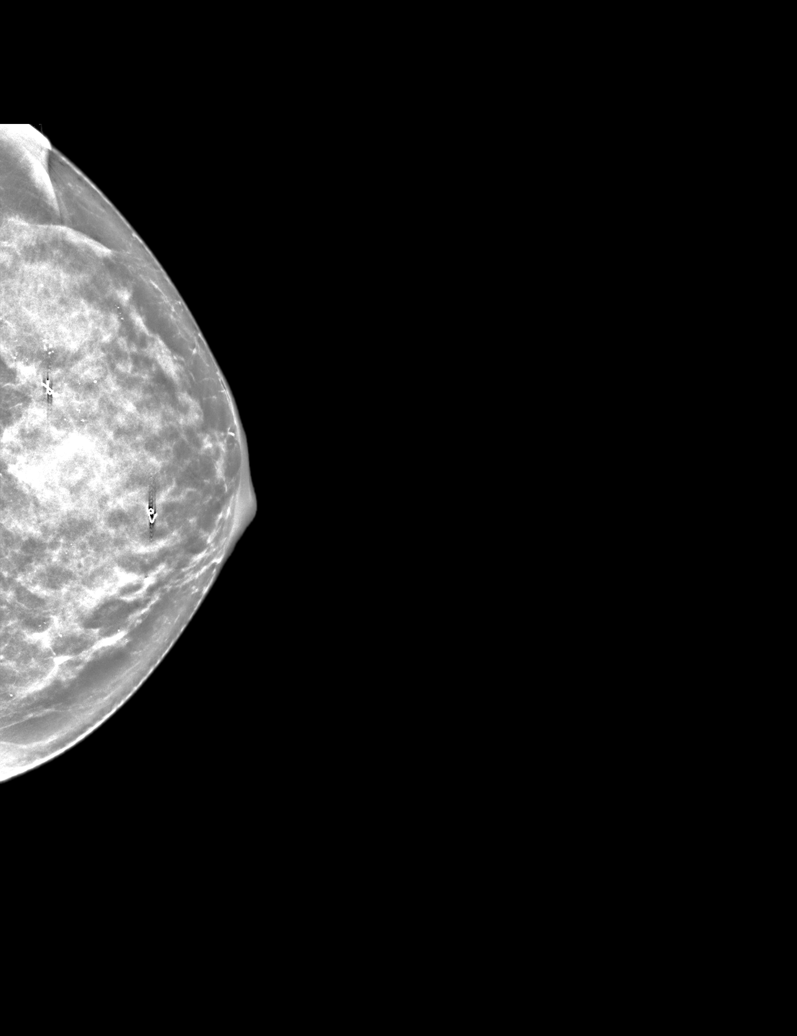

[L MLO synth-2D]
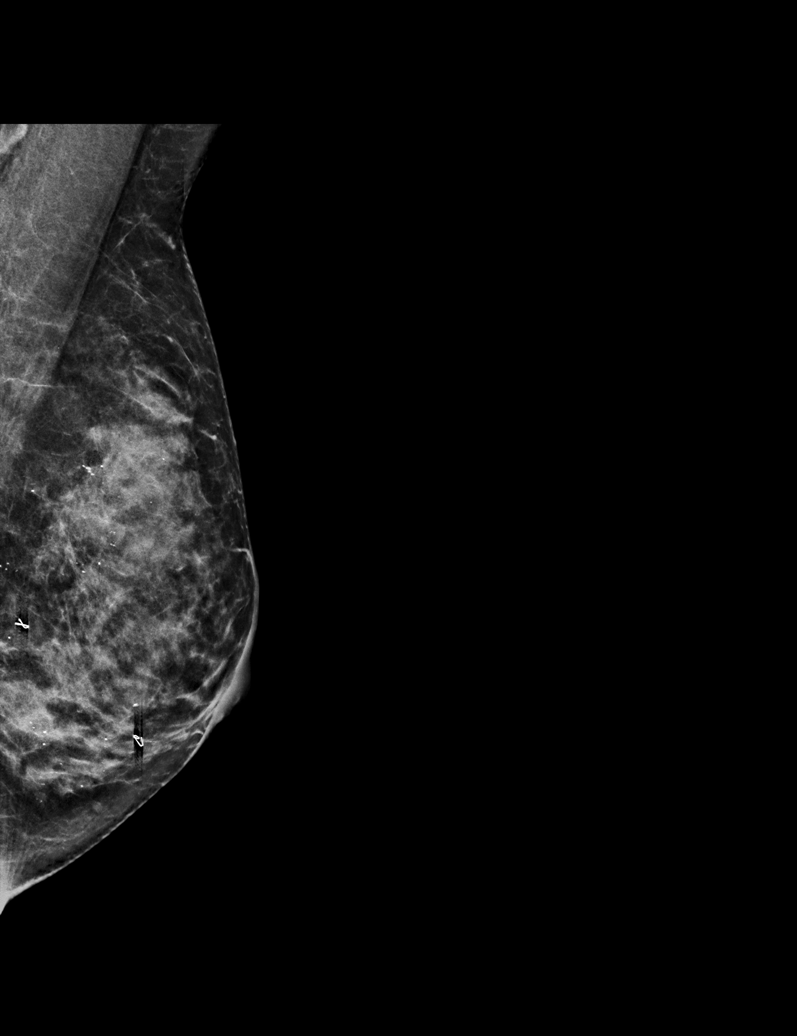

[L MLO tomo · tomo slice 27/52.0]
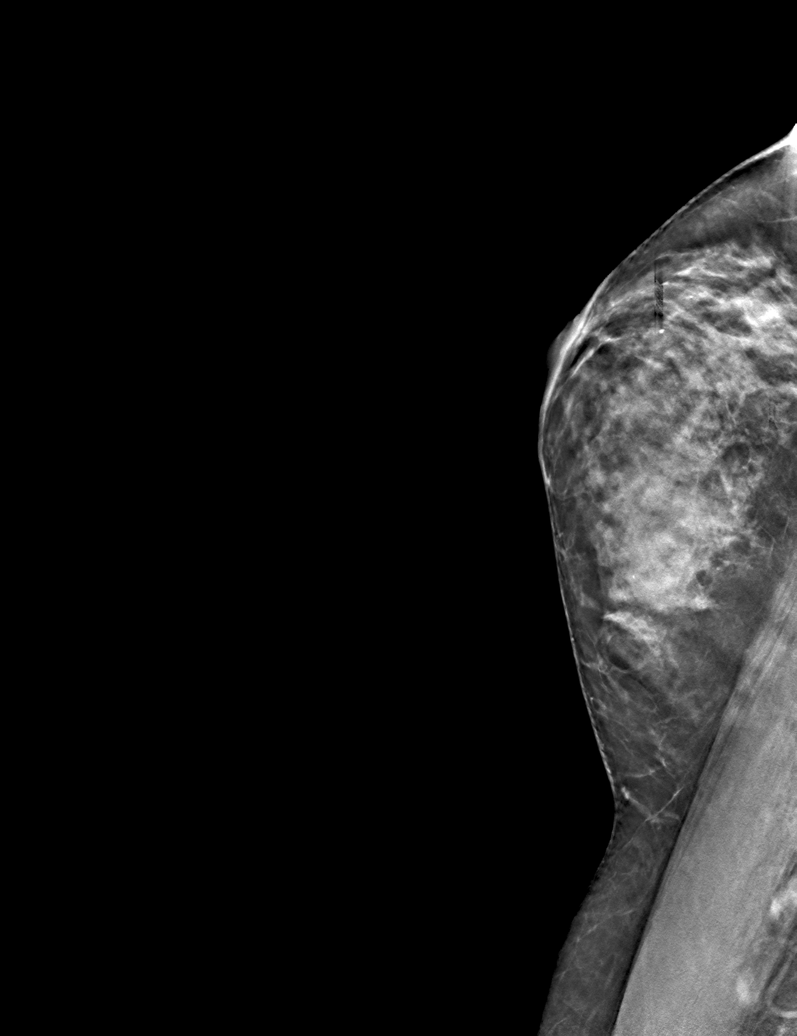

[6 of 30 positions shown; findings below may reference images not displayed]

ACR Breast Density Category d: The breast tissue is extremely dense,
which lowers the sensitivity of mammography.
FINDINGS: In the left breast, a possible asymmetry warrants further
evaluation. In the right breast, no findings suspicious for
malignancy.
IMPRESSION: Further evaluation is suggested for possible asymmetry in the left
breast.

RECOMMENDATION:
Diagnostic mammogram and possibly ultrasound of the left breast.
(Code:ET-F-33M)

The patient will be contacted regarding the findings, and additional
imaging will be scheduled.

BI-RADS CATEGORY  0: Incomplete. Need additional imaging evaluation
and/or prior mammograms for comparison.

## 2022-07-25 ENCOUNTER — Other Ambulatory Visit: Payer: Self-pay

## 2022-07-25 ENCOUNTER — Other Ambulatory Visit: Payer: Self-pay | Admitting: Internal Medicine

## 2022-07-25 DIAGNOSIS — Z1231 Encounter for screening mammogram for malignant neoplasm of breast: Secondary | ICD-10-CM

## 2022-08-09 ENCOUNTER — Encounter: Payer: Self-pay | Admitting: Family Medicine

## 2022-08-12 ENCOUNTER — Encounter: Payer: Self-pay | Admitting: Oncology

## 2022-08-12 ENCOUNTER — Inpatient Hospital Stay: Payer: BC Managed Care – PPO

## 2022-08-12 ENCOUNTER — Inpatient Hospital Stay: Payer: BC Managed Care – PPO | Attending: Oncology | Admitting: Oncology

## 2022-08-12 DIAGNOSIS — R7401 Elevation of levels of liver transaminase levels: Secondary | ICD-10-CM | POA: Insufficient documentation

## 2022-08-12 DIAGNOSIS — D7589 Other specified diseases of blood and blood-forming organs: Secondary | ICD-10-CM | POA: Diagnosis not present

## 2022-08-12 NOTE — Progress Notes (Signed)
Removed 300 ml of blood per Phlebotomy order. Pt tolerated well. VSS. Discharged to home.

## 2022-08-12 NOTE — Progress Notes (Signed)
Patient here for follow up. Pt reports that she had a colonoscopy on Friday and she still hasnt had a bowel movement. Pt would like for Dr. Cathie Hoops to review colonoscopy results.

## 2022-08-12 NOTE — Patient Instructions (Signed)

## 2022-08-12 NOTE — Progress Notes (Signed)
Hematology/Oncology Progress note [phone] [phone]      Patient Care Team: Lynnea Ferrier, MD as PCP - General (Internal Medicine)  REFERRING PROVIDER: Lynnea Ferrier, MD REASON FOR VISIT Follow up for treatment of iron overload and hemochromatosis.   HISTORY OF PRESENTING ILLNESS:  Anna Mcgrath is a  61 y.o.  female with PMH listed below who was referred to me for evaluation of hemochromatosis.  Patient recent had lab work up done at [facility] office.  Reviewed lab report from [facility].  08/10/2018 Ferritin 446, wbc 6.7, Hemoglobin 13.8, MCV 99, normal differential, TSH 1.56, Creatinine 0.79,  She had a repeat test done on 08/14/2018  ferritin 580, serum iron 178, iron saturation 63% TIBC 284.  B12 496, Folate 16.9.  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  She reports chronic fatigue and also hand joint pain from chronic arthritis.   Denies family history of hemochromatosis. Her parents are deceased. Patient feels that " my parents may have passed one hemochromatosis gene to me".   INTERVAL HISTORY Anna Mcgrath is a 61 y.o. 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  Patient has had lab work done at [facility]. Patient drinks alcohol occasionally-about 5 times for the past 6 months. She recently had a colonoscopy done and had polyp removed.  Pathology is pending.  She has no new other complaints. She feels fatigued when her ferritin is above 50 .   Review of Systems  Constitutional:  Positive for malaise/fatigue. Negative for chills, fever and weight loss.  HENT:  Negative for sore throat.   Eyes:  Negative for redness.  Respiratory:  Negative for cough, shortness of breath and wheezing.   Cardiovascular:  Negative for chest pain, palpitations and leg swelling.  Gastrointestinal:  Negative for abdominal pain, blood in stool,  heartburn, nausea and vomiting.  Genitourinary:  Negative for dysuria.  Musculoskeletal:  Negative for myalgias.  Skin:  Negative for rash.  Neurological:  Negative for dizziness, tingling and tremors.  Endo/Heme/Allergies:  Does not bruise/bleed easily.  Psychiatric/Behavioral:  Negative for hallucinations.     MEDICAL HISTORY:  Past Medical History:  Diagnosis Date   Asthma    Diabetes mellitus without complication (HCC) 03/25/2020   type II   Thyroid disease     SURGICAL HISTORY: Past Surgical History:  Procedure Laterality Date   APPENDECTOMY     BREAST BIOPSY Left 01/31/2016   Fibrocystic changes   BREAST BIOPSY Left 09/07/2021   Stereo Bx, X-clip, path pending   BREAST CYST ASPIRATION Left 01/19/2016   BREAST CYST ASPIRATION Left 03/10/2018   neg   HEMORRHOID SURGERY  1988    SOCIAL HISTORY: Social History   Socioeconomic History   Marital status: Married    Spouse name: [name]    Number of children: 2   Years of education: Not on file   Highest education level: Not on file  Occupational History   Not on file  Tobacco Use   Smoking status: Never   Smokeless tobacco: Never  Vaping Use   Vaping Use: Never used  Substance and Sexual Activity   Alcohol use: Not Currently   Drug use: Never   Sexual activity: Not on file  Other Topics Concern   Not on file  Social History Narrative   Not on file   Social Determinants of Health   Financial Resource Strain: Not  on file  Food Insecurity: Not on file  Transportation Needs: Not on file  Physical Activity: Not on file  Stress: Not on file  Social Connections: Not on file  Intimate Partner Violence: Not on file    FAMILY HISTORY: Family History  Problem Relation Age of Onset   Non-Hodgkin's lymphoma Brother    Cancer Maternal Grandfather    Breast cancer Neg Hx     ALLERGIES:  is allergic to azithromycin, gatifloxacin, and penicillin g.  MEDICATIONS:  Current Outpatient Medications  Medication  Sig Dispense Refill   cholecalciferol (VITAMIN D3) 25 MCG (1000 UNIT) tablet Take 1,000 Units by mouth daily.     estradiol-norethindrone (ACTIVELLA) 1-0.5 MG tablet TAKE 1 TABLET BY MOUTH EVERY DAY     fluticasone (FLOVENT HFA) 110 MCG/ACT inhaler Inhale into the lungs 2 (two) times daily.     levothyroxine (SYNTHROID, LEVOTHROID) 88 MCG tablet Take 88 mcg by mouth daily.     pantoprazole (PROTONIX) 40 MG tablet Take 40 mg by mouth 2 (two) times daily.     PREMARIN vaginal cream PLEASE SEE ATTACHED FOR DETAILED DIRECTIONS     rosuvastatin (CRESTOR) 5 MG tablet Take 5 mg by mouth daily.     triamcinolone (NASACORT) 55 MCG/ACT AERO nasal inhaler Place 2 sprays into the nose daily.     levalbuterol (XOPENEX) 1.25 MG/3ML nebulizer solution INHALE 3 MILLILITERS (1.25 MG) BY NEBULIZATION ROUTE 3 TIMES PER DAY AS NEEDED (Patient not taking: Reported on 02/12/2022)     PROAIR HFA 108 (90 Base) MCG/ACT inhaler TAKE 2 PUFFS BY MOUTH EVERY 4 TO 6 HOURS AS NEEDED (Patient not taking: Reported on 08/12/2022)     No current facility-administered medications for this visit.     PHYSICAL EXAMINATION: ECOG PERFORMANCE STATUS: 0 - Asymptomatic Vitals:   08/12/22 1322  BP: (!) 136/90  Pulse: 96  Resp: 18  Temp: 99.2 F (37.3 C)   Filed Weights   08/12/22 1322  Weight: 152 lb 9.6 oz (69.2 kg)    Physical Exam Constitutional:      General: She is not in acute distress. HENT:     Head: Normocephalic and atraumatic.     Right Ear: External ear normal.     Left Ear: External ear normal.  Eyes:     General: No scleral icterus.    Pupils: Pupils are equal, round, and reactive to light.  Cardiovascular:     Rate and Rhythm: Normal rate and regular rhythm.     Heart sounds: Normal heart sounds.  Pulmonary:     Effort: Pulmonary effort is normal. No respiratory distress.     Breath sounds: No wheezing.  Abdominal:     General: Bowel sounds are normal. There is no distension.     Palpations:  Abdomen is soft. There is no mass.     Tenderness: There is no abdominal tenderness.  Musculoskeletal:        General: No deformity. Normal range of motion.     Cervical back: Normal range of motion and neck supple.  Skin:    General: Skin is warm and dry.     Findings: No erythema or rash.  Neurological:     Mental Status: She is alert and oriented to person, place, and time. Mental status is at baseline.     Cranial Nerves: No cranial nerve deficit.     Coordination: Coordination normal.  Psychiatric:        Mood and Affect: Mood normal.  Behavior: Behavior normal.        Thought Content: Thought content normal.      LABORATORY DATA:  [facility] labs reviewed.  11/27/2018 Hemoglobin 12, ferritin 23 01/25/2019 TSAT 31% ferritin 20 hemoglobin 13. 05/11/2019 hemoglobin 14.8, hematocrit 43.2, iron saturation 23%, ferritin 41 07/28/2019 hemoglobin 14.7, hematocrit 44.9 MCV 100 iron saturation 40%, ferritin 32 01/25/2020, hemoglobin 14.9, hematocrit 43.8, normal WBC and differential. Iron saturation 33, ferritin 39. 02/03/2021, creatinine 0.82, bilirubin 0.3, alkaline phosphatase 64, AST 15, ALT 12.  Hemoglobin 14.3, MCV 98,  TIBC 275, ferritin 64, iron saturation 38. 08/09/2021 Hb 14.5, MCV 98.normal LFT. TIBC 50, ferritin 70,  02/11/2022 ALT 47, ferritin 48, iron saturation 40, TIBC 340. 08/07/2022, hemoglobin 14.7, MCV 101, creatinine 0.85, ferritin 77, iron saturation 31, alkaline phosphatase 66, AST 12, ALT 9, bilirubin 0.5.  RADIOGRAPHIC STUDIES: I have personally reviewed the radiological images as listed and agreed with the findings in the report. 12/30/2018 ultrasound abdomen limited: Unremarkable right upper quadrant ultrasound. 01/18/2019 CT abdomen pelvis with contrast showed moderate fecal loading throughout the length of his prominent cystitis not excluded.  No other acute abnormalities   ASSESSMENT & PLAN:  1. Hereditary hemochromatosis (HCC)   2. Macrocytosis without  anemia    #Hereditary hemochromatosis/iron overload, Labs done at [facility] were reviewed and discussed with patient. Ferritin is above 50.  Proceed with 300 cc phlebotomy.  #Slightly elevated ALT, normalized.  Elevated MCV, check vitamin B12, folate, LDH. #Occasional alcohol use, recommend patient to stop alcohol use completely.  Follow-up in the clinic in 6 months with repeat blood work for evaluation of additional need of phlebotomy. [facility] prescriptions were provided to patient.  The patient knows to call the clinic with any problems questions or concerns.   Rickard Patience, MD, PhD Hematology Oncology   08/12/2022

## 2022-08-13 ENCOUNTER — Encounter: Payer: Self-pay | Admitting: Internal Medicine

## 2022-08-14 ENCOUNTER — Encounter: Payer: Self-pay | Admitting: Family Medicine

## 2022-10-08 ENCOUNTER — Other Ambulatory Visit: Payer: Self-pay | Admitting: Internal Medicine

## 2022-10-08 DIAGNOSIS — E119 Type 2 diabetes mellitus without complications: Secondary | ICD-10-CM

## 2022-10-08 DIAGNOSIS — R140 Abdominal distension (gaseous): Secondary | ICD-10-CM

## 2022-10-08 DIAGNOSIS — R1084 Generalized abdominal pain: Secondary | ICD-10-CM

## 2022-10-10 ENCOUNTER — Ambulatory Visit
Admission: RE | Admit: 2022-10-10 | Discharge: 2022-10-10 | Disposition: A | Discharge status: Home / Self Care | Payer: BC Managed Care – PPO | Source: Ambulatory Visit

## 2022-10-10 DIAGNOSIS — Z1231 Encounter for screening mammogram for malignant neoplasm of breast: Secondary | ICD-10-CM | POA: Insufficient documentation

## 2022-10-18 ENCOUNTER — Encounter
Admission: RE | Admit: 2022-10-18 | Discharge: 2022-10-18 | Disposition: A | Discharge status: Home / Self Care | Payer: BC Managed Care – PPO | Source: Ambulatory Visit | Attending: Internal Medicine | Admitting: Internal Medicine

## 2022-10-18 DIAGNOSIS — E119 Type 2 diabetes mellitus without complications: Secondary | ICD-10-CM | POA: Diagnosis present

## 2022-10-18 DIAGNOSIS — R140 Abdominal distension (gaseous): Secondary | ICD-10-CM | POA: Insufficient documentation

## 2022-10-18 DIAGNOSIS — R1084 Generalized abdominal pain: Secondary | ICD-10-CM | POA: Insufficient documentation

## 2022-10-18 MED ORDER — TECHNETIUM TC 99M SULFUR COLLOID
2.2000 | Freq: Once | INTRAVENOUS | Status: AC | PRN
  Administered 2022-10-18: 2.2 via ORAL

## 2023-02-10 ENCOUNTER — Encounter: Payer: Self-pay | Admitting: Family Medicine

## 2023-02-12 ENCOUNTER — Inpatient Hospital Stay: Payer: BC Managed Care – PPO

## 2023-02-12 ENCOUNTER — Encounter: Payer: Self-pay | Admitting: Oncology

## 2023-02-12 ENCOUNTER — Inpatient Hospital Stay: Payer: BC Managed Care – PPO | Attending: Oncology | Admitting: Oncology

## 2023-02-12 DIAGNOSIS — D7589 Other specified diseases of blood and blood-forming organs: Secondary | ICD-10-CM | POA: Diagnosis not present

## 2023-02-12 DIAGNOSIS — Z79899 Other long term (current) drug therapy: Secondary | ICD-10-CM | POA: Insufficient documentation

## 2023-02-12 NOTE — Assessment & Plan Note (Signed)
Chronically microcytic.  Observation.

## 2023-02-12 NOTE — Assessment & Plan Note (Addendum)
#  Hereditary hemochromatosis/iron overload, Labs done at [facility] were reviewed and discussed with patient. Ferritin is 75, which is above 50.  Proceed with 300 cc phlebotomy.

## 2023-02-12 NOTE — Progress Notes (Signed)
Hematology/Oncology Progress note [phone] [phone]    REASON FOR VISIT Follow up for treatment of iron overload and hemochromatosis.   ASSESSMENT & PLAN:   Hemochromatosis #Hereditary hemochromatosis/iron overload, Labs done at [facility] were reviewed and discussed with patient. Ferritin is 75, which is above 50.  Proceed with 300 cc phlebotomy.  Macrocytosis without anemia Chronically microcytic.  Observation.  Follow-up in the clinic in 6 months with repeat blood work for evaluation of additional need of phlebotomy. [facility] prescriptions were provided to patient. All questions were answered. The patient knows to call the clinic with any problems, questions or concerns.  Earlie Server, MD, PhD [facility] Hematology Oncology 02/12/2023     HISTORY OF PRESENTING ILLNESS:  Anna Mcgrath is a  62 y.o.  female with PMH listed below who was referred to me for evaluation of hemochromatosis.  Patient recent had lab work up done at [facility] office.  Reviewed lab report from [facility].  08/10/2018 Ferritin 446, wbc 6.7, Hemoglobin 13.8, MCV 99, normal differential, TSH 1.56, Creatinine 0.79,  She had a repeat test done on 08/14/2018  ferritin 580, serum iron 178, iron saturation 63% TIBC 284.  B12 496, Folate 16.9.  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  She reports chronic fatigue and also hand joint pain from chronic arthritis.   Denies family history of hemochromatosis. Her parents are deceased. Patient feels that " my parents may have passed one hemochromatosis gene to me".   INTERVAL HISTORY Anna Mcgrath is a 62 y.o. 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  Patient has had lab work done at [facility]. Patient drinks alcohol very occasionally. She feels fatigued, brain fog when her ferritin is above 50 .   Review of Systems  Constitutional:   Positive for malaise/fatigue. Negative for chills, fever and weight loss.  HENT:  Negative for sore throat.   Eyes:  Negative for redness.  Respiratory:  Negative for cough, shortness of breath and wheezing.   Cardiovascular:  Negative for chest pain, palpitations and leg swelling.  Gastrointestinal:  Negative for abdominal pain, blood in stool, heartburn, nausea and vomiting.  Genitourinary:  Negative for dysuria.  Musculoskeletal:  Negative for myalgias.  Skin:  Negative for rash.  Neurological:  Negative for dizziness, tingling and tremors.  Endo/Heme/Allergies:  Does not bruise/bleed easily.  Psychiatric/Behavioral:  Negative for hallucinations.     MEDICAL HISTORY:  Past Medical History:  Diagnosis Date   Asthma    Diabetes mellitus without complication ([place]) 123456   type II   Thyroid disease     SURGICAL HISTORY: Past Surgical History:  Procedure Laterality Date   APPENDECTOMY     BREAST BIOPSY Left 01/31/2016   Fibrocystic changes   BREAST BIOPSY Left 09/07/2021   Stereo Bx, X-clip, path pending   BREAST CYST ASPIRATION Left 01/19/2016   BREAST CYST ASPIRATION Left 03/10/2018   neg   HEMORRHOID SURGERY  1988    SOCIAL HISTORY: Social History   Socioeconomic History   Marital status: Married    Spouse name: [name]    Number of children: 2   Years of education: Not on file   Highest education level: Not on file  Occupational History   Not on file  Tobacco Use   Smoking status: Never   Smokeless tobacco: Never  Vaping Use   Vaping Use: Never used  Substance and Sexual Activity  Alcohol use: Not Currently   Drug use: Never   Sexual activity: Not on file  Other Topics Concern   Not on file  Social History Narrative   Not on file   Social Determinants of Health   Financial Resource Strain: Not on file  Food Insecurity: Not on file  Transportation Needs: Not on file  Physical Activity: Not on file  Stress: Not on file  Social Connections: Not  on file  Intimate Partner Violence: Not on file    FAMILY HISTORY: Family History  Problem Relation Age of Onset   Non-Hodgkin's lymphoma Brother    Cancer Maternal Grandfather    Breast cancer Neg Hx     ALLERGIES:  is allergic to azithromycin, gatifloxacin, and penicillin g.  MEDICATIONS:  Current Outpatient Medications  Medication Sig Dispense Refill   estradiol-norethindrone (ACTIVELLA) 1-0.5 MG tablet TAKE 1 TABLET BY MOUTH EVERY DAY     Fish Oil-Cholecalciferol (OMEGA-3 + VITAMIN D3 PO) Take by mouth.     levalbuterol (XOPENEX) 1.25 MG/3ML nebulizer solution      levothyroxine (SYNTHROID, LEVOTHROID) 88 MCG tablet Take 88 mcg by mouth daily.     Multiple Vitamins-Minerals (HM MULTIVITAMIN ADULT GUMMY PO) Take by mouth.     pantoprazole (PROTONIX) 40 MG tablet Take 40 mg by mouth 2 (two) times daily.     PREMARIN vaginal cream PLEASE SEE ATTACHED FOR DETAILED DIRECTIONS     rosuvastatin (CRESTOR) 5 MG tablet Take 5 mg by mouth daily.     RYBELSUS 7 MG TABS Take 1 tablet by mouth daily.     triamcinolone (NASACORT) 55 MCG/ACT AERO nasal inhaler Place 2 sprays into the nose daily.     cholecalciferol (VITAMIN D3) 25 MCG (1000 UNIT) tablet Take 1,000 Units by mouth daily. (Patient not taking: Reported on 02/12/2023)     fluticasone (FLOVENT HFA) 110 MCG/ACT inhaler Inhale into the lungs 2 (two) times daily. (Patient not taking: Reported on 02/12/2023)     PROAIR HFA 108 (90 Base) MCG/ACT inhaler TAKE 2 PUFFS BY MOUTH EVERY 4 TO 6 HOURS AS NEEDED (Patient not taking: Reported on 08/12/2022)     No current facility-administered medications for this visit.     PHYSICAL EXAMINATION: ECOG PERFORMANCE STATUS: 0 - Asymptomatic Vitals:   02/12/23 1319  BP: (!) 153/75  Pulse: 71  Resp: 18  Temp: 98 F (36.7 C)  SpO2: 100%   Filed Weights   02/12/23 1319  Weight: 152 lb 1.6 oz (69 kg)    Physical Exam Constitutional:      General: She is not in acute distress. HENT:      Head: Normocephalic and atraumatic.     Right Ear: External ear normal.     Left Ear: External ear normal.  Eyes:     General: No scleral icterus.    Pupils: Pupils are equal, round, and reactive to light.  Cardiovascular:     Rate and Rhythm: Normal rate and regular rhythm.     Heart sounds: Normal heart sounds.  Pulmonary:     Effort: Pulmonary effort is normal. No respiratory distress.     Breath sounds: No wheezing.  Abdominal:     General: Bowel sounds are normal. There is no distension.     Palpations: Abdomen is soft. There is no mass.     Tenderness: There is no abdominal tenderness.  Musculoskeletal:        General: No deformity. Normal range of motion.     Cervical back:  Normal range of motion and neck supple.  Skin:    General: Skin is warm and dry.     Findings: No erythema or rash.  Neurological:     Mental Status: She is alert and oriented to person, place, and time. Mental status is at baseline.     Cranial Nerves: No cranial nerve deficit.     Coordination: Coordination normal.  Psychiatric:        Mood and Affect: Mood normal.        Behavior: Behavior normal.        Thought Content: Thought content normal.      LABORATORY DATA:  [facility] labs reviewed.  11/27/2018 Hemoglobin 12, ferritin 23 01/25/2019 TSAT 31% ferritin 20 hemoglobin 13. 05/11/2019 hemoglobin 14.8, hematocrit 43.2, iron saturation 23%, ferritin 41 07/28/2019 hemoglobin 14.7, hematocrit 44.9 MCV 100 iron saturation 40%, ferritin 32 01/25/2020, hemoglobin 14.9, hematocrit 43.8, normal WBC and differential. Iron saturation 33, ferritin 39. 02/03/2021, creatinine 0.82, bilirubin 0.3, alkaline phosphatase 64, AST 15, ALT 12.  Hemoglobin 14.3, MCV 98,  TIBC 275, ferritin 64, iron saturation 38. 08/09/2021 Hb 14.5, MCV 98.normal LFT. TIBC 50, ferritin 70,  02/11/2022 ALT 47, ferritin 48, iron saturation 40, TIBC 340. 08/07/2022, hemoglobin 14.7, MCV 101, creatinine 0.85, ferritin 77, iron saturation 31,  alkaline phosphatase 66, AST 12, ALT 9, bilirubin 0.5. 02/06/2023 hemoglobin 14.6, hematocrit 24.6, MCV 100.  Creatinine 0.89, alkaline phosphatase 55, AST 15, ALT 13.  Bilirubin 0.6 ferritin 75, iron saturation 43, TIBC 287.  B12 636, folate>20  RADIOGRAPHIC STUDIES: I have personally reviewed the radiological images as listed and agreed with the findings in the report. 12/30/2018 ultrasound abdomen limited: Unremarkable right upper quadrant ultrasound. 01/18/2019 CT abdomen pelvis with contrast showed moderate fecal loading throughout the length of his prominent cystitis not excluded.  No other acute abnormalities

## 2023-08-11 ENCOUNTER — Encounter: Payer: Self-pay | Admitting: Family Medicine

## 2023-08-13 ENCOUNTER — Encounter: Payer: Self-pay | Admitting: Oncology

## 2023-08-13 ENCOUNTER — Inpatient Hospital Stay: Payer: BC Managed Care – PPO

## 2023-08-13 ENCOUNTER — Inpatient Hospital Stay: Payer: BC Managed Care – PPO | Attending: Oncology | Admitting: Oncology

## 2023-08-13 ENCOUNTER — Other Ambulatory Visit: Payer: BC Managed Care – PPO

## 2023-08-13 DIAGNOSIS — D7589 Other specified diseases of blood and blood-forming organs: Secondary | ICD-10-CM

## 2023-08-13 DIAGNOSIS — H539 Unspecified visual disturbance: Secondary | ICD-10-CM | POA: Diagnosis not present

## 2023-08-13 NOTE — Assessment & Plan Note (Deleted)
Vision flashes, facial tickling sensation followed by hot flash Recommend patient to further discuss with PCP, consider neurology evaluation.

## 2023-08-13 NOTE — Progress Notes (Addendum)
Hematology/Oncology Progress note [phone] [phone]    REASON FOR VISIT Follow up for treatment of iron overload and hemochromatosis.   ASSESSMENT & PLAN:   Hemochromatosis #Hereditary hemochromatosis/iron overload,   homozygous H63D mutation.  Labs done at [facility] were reviewed and discussed with patient. Iron saturation 56 Ferritin is 69, which is above 50.   proceed with 300 cc phlebotomy.  Macrocytosis without anemia Chronically microcytic.  Observation.  Follow-up in the clinic in 6 months with repeat blood work for evaluation of additional need of phlebotomy. [facility] prescriptions were provided to patient. All questions were answered. The patient knows to call the clinic with any problems, questions or concerns.  Rickard Patience, MD, PhD [facility] Hematology Oncology 08/13/2023     HISTORY OF PRESENTING ILLNESS:  Anna Mcgrath is a  62 y.o.  female with PMH listed below who was referred to me for evaluation of hemochromatosis.  Patient recent had lab work up done at [facility] office.  Reviewed lab report from [facility]  08/10/2018 Ferritin 446, wbc 6.7, Hemoglobin 13.8, MCV 99, normal differential, TSH 1.56, Creatinine 0.79,  She had a repeat test done on 08/14/2018  ferritin 580, serum iron 178, iron saturation 63% TIBC 284.  B12 496, Folate 16.9.  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  She reports chronic fatigue and also hand joint pain from chronic arthritis.   Denies family history of hemochromatosis. Her parents are deceased. Patient feels that " my parents may have passed one hemochromatosis gene to me".   INTERVAL HISTORY Anna Mcgrath is a 62 y.o. 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  Patient has had lab work done at [facility].   Review of Systems  Constitutional:  Positive for malaise/fatigue. Negative for chills, fever  and weight loss.  HENT:  Negative for sore throat.   Eyes:  Negative for redness.  Respiratory:  Negative for cough, shortness of breath and wheezing.   Cardiovascular:  Negative for chest pain, palpitations and leg swelling.  Gastrointestinal:  Negative for abdominal pain, blood in stool, heartburn, nausea and vomiting.  Genitourinary:  Negative for dysuria.  Musculoskeletal:  Negative for myalgias.  Skin:  Negative for rash.  Neurological:  Negative for dizziness, tingling and tremors.  Endo/Heme/Allergies:  Does not bruise/bleed easily.  Psychiatric/Behavioral:  Negative for hallucinations.     MEDICAL HISTORY:  Past Medical History:  Diagnosis Date   Asthma    Diabetes mellitus without complication (HCC) 03/25/2020   type II   Thyroid disease     SURGICAL HISTORY: Past Surgical History:  Procedure Laterality Date   APPENDECTOMY     BREAST BIOPSY Left 01/31/2016   Fibrocystic changes   BREAST BIOPSY Left 09/07/2021   Stereo Bx, X-clip, path pending   BREAST CYST ASPIRATION Left 01/19/2016   BREAST CYST ASPIRATION Left 03/10/2018   neg   HEMORRHOID SURGERY  1988    SOCIAL HISTORY: Social History   Socioeconomic History   Marital status: Married    Spouse name: [name]    Number of children: 2   Years of education: Not on file   Highest education level: Not on file  Occupational History   Not on file  Tobacco Use   Smoking status: Never   Smokeless tobacco: Never  Vaping Use   Vaping status: Never Used  Substance and Sexual Activity   Alcohol use: Not Currently  Drug use: Never   Sexual activity: Not on file  Other Topics Concern   Not on file  Social History Narrative   Not on file   Social Determinants of Health   Financial Resource Strain: Low Risk  (05/20/2023)   Received from [facility]   Overall Financial Resource Strain (CARDIA)    Difficulty of Paying Living Expenses: Not hard at all  Food Insecurity: Unknown (05/20/2023)    Received from [facility]   Hunger Vital Sign    Worried About Running Out of Food in the Last Year: Never true    Ran Out of Food in the Last Year: Not on file  Transportation Needs: No Transportation Needs (05/20/2023)   Received from [facility]    In the past 12 months, has lack of transportation kept you from medical appointments or from getting medications?: No    Lack of Transportation (Non-Medical): No  Physical Activity: Not on file  Stress: Not on file  Social Connections: Not on file  Intimate Partner Violence: Not on file    FAMILY HISTORY: Family History  Problem Relation Age of Onset   Non-Hodgkin's lymphoma Brother    Cancer Maternal Grandfather    Breast cancer Neg Hx     ALLERGIES:  is allergic to azithromycin, gatifloxacin, and penicillin g.  MEDICATIONS:  Current Outpatient Medications  Medication Sig Dispense Refill   estradiol-norethindrone (ACTIVELLA) 1-0.5 MG tablet TAKE 1 TABLET BY MOUTH EVERY DAY     levothyroxine (SYNTHROID, LEVOTHROID) 88 MCG tablet Take 88 mcg by mouth daily.     Multiple Vitamins-Minerals (HM MULTIVITAMIN ADULT GUMMY PO) Take by mouth.     PREMARIN vaginal cream PLEASE SEE ATTACHED FOR DETAILED DIRECTIONS     rosuvastatin (CRESTOR) 5 MG tablet Take 5 mg by mouth daily.     RYBELSUS 7 MG TABS Take 1 tablet by mouth daily.     triamcinolone (NASACORT) 55 MCG/ACT AERO nasal inhaler Place 2 sprays into the nose daily.     cholecalciferol (VITAMIN D3) 25 MCG (1000 UNIT) tablet Take 1,000 Units by mouth daily. (Patient not taking: Reported on 02/12/2023)     Fish Oil-Cholecalciferol (OMEGA-3 + VITAMIN D3 PO) Take by mouth. (Patient not taking: Reported on 08/13/2023)     fluticasone (FLOVENT HFA) 110 MCG/ACT inhaler Inhale into the lungs 2 (two) times daily. (Patient not taking: Reported on 08/13/2023)     levalbuterol (XOPENEX) 1.25 MG/3ML nebulizer solution  (Patient not taking:  Reported on 08/13/2023)     pantoprazole (PROTONIX) 40 MG tablet Take 40 mg by mouth 2 (two) times daily. (Patient not taking: Reported on 08/13/2023)     PROAIR HFA 108 (90 Base) MCG/ACT inhaler TAKE 2 PUFFS BY MOUTH EVERY 4 TO 6 HOURS AS NEEDED (Patient not taking: Reported on 08/12/2022)     No current facility-administered medications for this visit.     PHYSICAL EXAMINATION: ECOG PERFORMANCE STATUS: 0 - Asymptomatic Vitals:   08/13/23 1327  BP: 132/84  Pulse: 93  Resp: 18  Temp: 98.6 F (37 C)  SpO2: 99%   Filed Weights   08/13/23 1327  Weight: 144 lb (65.3 kg)    Physical Exam Constitutional:      General: She is not in acute distress. HENT:     Head: Normocephalic and atraumatic.     Right Ear: External ear normal.     Left Ear: External ear normal.  Eyes:  General: No scleral icterus.    Pupils: Pupils are equal, round, and reactive to light.  Cardiovascular:     Rate and Rhythm: Normal rate and regular rhythm.     Heart sounds: Normal heart sounds.  Pulmonary:     Effort: Pulmonary effort is normal. No respiratory distress.     Breath sounds: No wheezing.  Abdominal:     General: Bowel sounds are normal. There is no distension.     Palpations: Abdomen is soft. There is no mass.     Tenderness: There is no abdominal tenderness.  Musculoskeletal:        General: No deformity. Normal range of motion.     Cervical back: Normal range of motion and neck supple.  Skin:    General: Skin is warm and dry.     Findings: No erythema or rash.  Neurological:     Mental Status: She is alert and oriented to person, place, and time. Mental status is at baseline.     Cranial Nerves: No cranial nerve deficit.     Coordination: Coordination normal.  Psychiatric:        Mood and Affect: Mood normal.        Behavior: Behavior normal.        Thought Content: Thought content normal.      LABORATORY DATA:  [facility] labs reviewed.  11/27/2018 Hemoglobin 12, ferritin  23 01/25/2019 TSAT 31% ferritin 20 hemoglobin 13. 05/11/2019 hemoglobin 14.8, hematocrit 43.2, iron saturation 23%, ferritin 41 07/28/2019 hemoglobin 14.7, hematocrit 44.9 MCV 100 iron saturation 40%, ferritin 32 01/25/2020, hemoglobin 14.9, hematocrit 43.8, normal WBC and differential. Iron saturation 33, ferritin 39. 02/03/2021, creatinine 0.82, bilirubin 0.3, alkaline phosphatase 64, AST 15, ALT 12.  Hemoglobin 14.3, MCV 98,  TIBC 275, ferritin 64, iron saturation 38. 08/09/2021 Hb 14.5, MCV 98.normal LFT. TIBC 50, ferritin 70,  02/11/2022 ALT 47, ferritin 48, iron saturation 40, TIBC 340. 08/07/2022, hemoglobin 14.7, MCV 101, creatinine 0.85, ferritin 77, iron saturation 31, alkaline phosphatase 66, AST 12, ALT 9, bilirubin 0.5. 02/06/2023 hemoglobin 14.6, hematocrit 24.6, MCV 100.  Creatinine 0.89, alkaline phosphatase 55, AST 15, ALT 13.  Bilirubin 0.6 ferritin 75, iron saturation 43, TIBC 287.  B12 636, folate>20  RADIOGRAPHIC STUDIES: I have personally reviewed the radiological images as listed and agreed with the findings in the report. 12/30/2018 ultrasound abdomen limited: Unremarkable right upper quadrant ultrasound. 01/18/2019 CT abdomen pelvis with contrast showed moderate fecal loading throughout the length of his prominent cystitis not excluded.  No other acute abnormalities

## 2023-08-13 NOTE — Assessment & Plan Note (Addendum)
#  Hereditary hemochromatosis/iron overload,   homozygous H63D mutation.  Labs done at [facility] were reviewed and discussed with patient. Iron saturation 56 Ferritin is 69, which is above 50.   proceed with 300 cc phlebotomy.

## 2023-08-13 NOTE — Patient Instructions (Signed)

## 2023-08-13 NOTE — Assessment & Plan Note (Signed)
Chronically microcytic.  Observation.

## 2023-09-17 ENCOUNTER — Other Ambulatory Visit: Payer: Self-pay | Admitting: Internal Medicine

## 2023-09-17 DIAGNOSIS — Z1231 Encounter for screening mammogram for malignant neoplasm of breast: Secondary | ICD-10-CM

## 2023-10-06 ENCOUNTER — Encounter: Payer: Self-pay | Admitting: Oncology

## 2023-10-13 ENCOUNTER — Ambulatory Visit
Admission: RE | Admit: 2023-10-13 | Discharge: 2023-10-13 | Disposition: A | Discharge status: Home / Self Care | Payer: BC Managed Care – PPO | Source: Ambulatory Visit | Attending: Internal Medicine | Admitting: Internal Medicine

## 2023-10-13 ENCOUNTER — Encounter: Payer: Self-pay | Admitting: Oncology

## 2023-10-13 DIAGNOSIS — Z1231 Encounter for screening mammogram for malignant neoplasm of breast: Secondary | ICD-10-CM | POA: Diagnosis present

## 2023-11-28 ENCOUNTER — Other Ambulatory Visit: Payer: Self-pay | Admitting: Internal Medicine

## 2023-11-28 DIAGNOSIS — R319 Hematuria, unspecified: Secondary | ICD-10-CM

## 2023-12-02 ENCOUNTER — Other Ambulatory Visit: Payer: Self-pay | Admitting: Internal Medicine

## 2023-12-02 DIAGNOSIS — R319 Hematuria, unspecified: Secondary | ICD-10-CM

## 2023-12-12 ENCOUNTER — Other Ambulatory Visit: Payer: Self-pay | Admitting: Internal Medicine

## 2023-12-12 ENCOUNTER — Ambulatory Visit
Admission: RE | Admit: 2023-12-12 | Discharge: 2023-12-12 | Disposition: A | Discharge status: Home / Self Care | Payer: BC Managed Care – PPO | Source: Ambulatory Visit | Attending: Internal Medicine | Admitting: Internal Medicine

## 2023-12-12 DIAGNOSIS — R319 Hematuria, unspecified: Secondary | ICD-10-CM

## 2024-02-13 ENCOUNTER — Ambulatory Visit: Payer: BC Managed Care – PPO | Admitting: Urology

## 2024-02-13 ENCOUNTER — Encounter: Payer: Self-pay | Admitting: Urology

## 2024-02-13 ENCOUNTER — Encounter: Payer: Self-pay | Admitting: Oncology

## 2024-02-13 VITALS — BP 119/72 | HR 95 | Ht 67.0 in | Wt 133.0 lb

## 2024-02-13 DIAGNOSIS — R3129 Other microscopic hematuria: Secondary | ICD-10-CM | POA: Diagnosis not present

## 2024-02-13 LAB — URINALYSIS, COMPLETE
Bilirubin, UA: NEGATIVE
Glucose, UA: NEGATIVE
Ketones, UA: NEGATIVE
Nitrite, UA: NEGATIVE
Specific Gravity, UA: 1.025 (ref 1.005–1.030)
Urobilinogen, Ur: 1 mg/dL (ref 0.2–1.0)
pH, UA: 6.5 (ref 5.0–7.5)

## 2024-02-13 LAB — MICROSCOPIC EXAMINATION: Epithelial Cells (non renal): >10 /HPF — AB (ref 0–10)

## 2024-02-13 NOTE — Patient Instructions (Signed)

## 2024-02-13 NOTE — Progress Notes (Signed)
 I, Maysun Anabel Bene, acting as a scribe for Riki Altes, MD., have documented all relevant documentation on the behalf of Riki Altes, MD, as directed by Riki Altes, MD while in the presence of Riki Altes, MD.  02/13/2024 10:52 AM   Unk Pinto 05/12/1961 [id number]  Referring provider: Lynnea Ferrier, MD [address] [facility] [place],  [place] [address]  Chief Complaint  Patient presents with   Hematuria    HPI: Anna Mcgrath is a 63 y.o. female referred for evaluation of microhematuria.  On a routine urinalysis for her annual physical in early December 2024, she was found to have 6-10 WBCs and 3-10 RBCs on microscopic urinalysis, with significant squamous epithelial cells >10.  A repeat urinalysis on 11/25/23 showed 17 RBCs on microscopy without significant squamous epithelial cells.  Another urinalysis on 1/16 showed 15 RBCs. She is a non-smoker and denies previous history of urologic problems.  Denies gross hematuria Has noted some slowing of her urinary stream over the past few months. Urine culture performed on 11/25/23 grew mixed flora.  No previous tobacco history A CT renal stone study was ordered, which showed no abnormalities   PMH: Past Medical History:  Diagnosis Date   Asthma    Diabetes mellitus without complication (HCC) 03/25/2020   type II   Thyroid disease     Surgical History: Past Surgical History:  Procedure Laterality Date   APPENDECTOMY     BREAST BIOPSY Left 01/31/2016   Fibrocystic changes   BREAST BIOPSY Left 09/07/2021   Stereo Bx, X-clip, path pending   BREAST CYST ASPIRATION Left 01/19/2016   BREAST CYST ASPIRATION Left 03/10/2018   neg   HEMORRHOID SURGERY  1988    Home Medications:  Allergies as of 02/13/2024       Reactions   Azithromycin Rash   Gatifloxacin Rash   Penicillin G Rash        Medication List        Accurate as of February 13, 2024 10:52 AM. If you have any  questions, ask your nurse or doctor.          STOP taking these medications    cholecalciferol 25 MCG (1000 UNIT) tablet Commonly known as: VITAMIN D3 Stopped by: Verna Czech Klay Sobotka   fluticasone 110 MCG/ACT inhaler Commonly known as: FLOVENT HFA Stopped by: Riki Altes   levalbuterol 1.25 MG/3ML nebulizer solution Commonly known as: XOPENEX Stopped by: Riki Altes   OMEGA-3 + VITAMIN D3 PO Stopped by: Riki Altes   pantoprazole 40 MG tablet Commonly known as: PROTONIX Stopped by: Riki Altes   ProAir HFA 108 (90 Base) MCG/ACT inhaler Generic drug: albuterol Stopped by: Riki Altes       TAKE these medications    estradiol-norethindrone 1-0.5 MG tablet Commonly known as: ACTIVELLA TAKE 1 TABLET BY MOUTH EVERY DAY   HM MULTIVITAMIN ADULT GUMMY PO Take by mouth.   levothyroxine 88 MCG tablet Commonly known as: SYNTHROID Take 88 mcg by mouth daily.   Premarin vaginal cream Generic drug: conjugated estrogens PLEASE SEE ATTACHED FOR DETAILED DIRECTIONS   rosuvastatin 5 MG tablet Commonly known as: CRESTOR Take 5 mg by mouth daily.   Rybelsus 7 MG Tabs Generic drug: Semaglutide Take 1 tablet by mouth daily.   triamcinolone 55 MCG/ACT Aero nasal inhaler Commonly known as: NASACORT Place 2 sprays into the nose daily.        Allergies:  Allergies  Allergen Reactions   Azithromycin Rash   Gatifloxacin Rash   Penicillin G Rash    Family History: Family History  Problem Relation Age of Onset   Non-Hodgkin's lymphoma Brother    Cancer Maternal Grandfather    Breast cancer Neg Hx     Social History:  reports that she has never smoked. She has never used smokeless tobacco. She reports that she does not currently use alcohol. She reports that she does not use drugs.   Physical Exam: BP 119/72   Pulse 95   Ht 5\' 7"  (1.702 m)   Wt 133 lb (60.3 kg)   LMP 03/21/2017 (Exact Date)   BMI 20.83 kg/m   Constitutional:  Alert and  oriented, No acute distress. HEENT: [place] AT, moist mucus membranes.  Trachea midline, no masses. Cardiovascular: No clubbing, cyanosis, or edema. Respiratory: Normal respiratory effort, no increased work of breathing. GI: Abdomen is soft, nontender, nondistended, no abdominal masses Skin: No rashes, bruises or suspicious lesions. Neurologic: Grossly intact, no focal deficits, moving all 4 extremities. Psychiatric: Normal mood and affect.  Urinalysis Dipstick 2+ blood/1+ protein/2+ leukocytes, microscopy 11-30 WBC/11-30 RBC/>10 epis.   Pertinent Imaging: CT was personally reviewed and interpreted.   CT RENAL STONE STUDY  Narrative CLINICAL DATA:  Hematuria of unknown cause  EXAM: CT ABDOMEN AND PELVIS WITHOUT CONTRAST  TECHNIQUE: Multidetector CT imaging of the abdomen and pelvis was performed following the standard protocol without IV contrast.  RADIATION DOSE REDUCTION: This exam was performed according to the departmental dose-optimization program which includes automated exposure control, adjustment of the mA and/or kV according to patient size and/or use of iterative reconstruction technique.  COMPARISON:  01/18/2019  FINDINGS: Lower chest: No acute abnormality.  Hepatobiliary: No focal liver abnormality is seen. No gallstones, gallbladder wall thickening, or biliary dilatation.  Pancreas: Unremarkable. No pancreatic ductal dilatation or surrounding inflammatory changes.  Spleen: Normal in size without focal abnormality.  Adrenals/Urinary Tract: Adrenal glands, kidneys, and ureters are normal. No hydronephrosis or hydroureter. No nephrolithiasis. Evaluation of the bladder is limited due to underdistention.  Stomach/Bowel: No bowel dilatation to indicate ileus or obstruction. Few scattered ascending colon diverticula are present without evidence of acute diverticulitis. Appendix is not definitively identified, however no right lower quadrant inflammatory changes  are present to indicate occult appendicitis.  Vascular/Lymphatic: No significant vascular findings are present. No enlarged abdominal or pelvic lymph nodes.  Reproductive: Uterus and bilateral adnexa are unremarkable.  Other: No abdominal wall hernia or abnormality. No abdominopelvic ascites.  Musculoskeletal: No acute or significant osseous findings.  IMPRESSION: 1. No significant abnormality of the kidneys or ureters. 2. No hydronephrosis or hydroureter. 3. No nephrolithiasis.   Electronically Signed By: Acquanetta Belling M.D. On: 12/15/2023 07:27   Assessment & Plan:    1. Microhematuria Based on age, American [employer] Association hematuria risk stratification is high. Recommended evaluation for high-risk hematuria includes a CT urogram and cystoscopy. A CT renal stone study was previously ordered by her PCP and showed no abnormalities. Cystoscopy will be scheduled to evaluate the lower urinary tract. If cystoscopy shows no abnormalities and microhematuria persists, a CT urogram will be ordered. Cystoscopy was discussed and she has elected to schedule.  I have reviewed the above documentation for accuracy and completeness, and I agree with the above.   Riki Altes, MD  [facility] [address] [place], [place] [address] [phone]

## 2024-02-16 ENCOUNTER — Encounter: Payer: Self-pay | Admitting: Oncology

## 2024-02-16 ENCOUNTER — Inpatient Hospital Stay: Payer: BC Managed Care – PPO

## 2024-02-16 ENCOUNTER — Inpatient Hospital Stay: Payer: BC Managed Care – PPO | Attending: Oncology | Admitting: Oncology

## 2024-02-16 DIAGNOSIS — D7589 Other specified diseases of blood and blood-forming organs: Secondary | ICD-10-CM

## 2024-02-16 MED ORDER — SODIUM CHLORIDE 0.9 % IV SOLN
Freq: Once | INTRAVENOUS | Status: DC
Start: 2024-02-16 — End: 2024-02-16
  Filled 2024-02-16: qty 250

## 2024-02-16 NOTE — Assessment & Plan Note (Addendum)
#  Hereditary hemochromatosis/iron overload,   homozygous H63D mutation.  Labs done at [facility] were reviewed and discussed with patient. Iron saturation 56 Ferritin is 124,  slightly elevated transaminitis proceed with 300 cc phlebotomy weekly x 3

## 2024-02-16 NOTE — Progress Notes (Signed)
 Patient tolatered phlebotomy well today, performed in LAC using 20g angiocath. 300 cc removed as ordered. Ferritin 124 . No concerns voiced. Snack and po fluids offered. Stable at discharge. Decline post IV fluids. Refused AVS

## 2024-02-16 NOTE — Patient Instructions (Signed)

## 2024-02-16 NOTE — Assessment & Plan Note (Signed)
 Chronically macrocytic with no cytopenia.  Observation.

## 2024-02-16 NOTE — Progress Notes (Signed)
 Hematology/Oncology Progress note [phone] [phone]    REASON FOR VISIT Follow up for treatment of iron overload and hemochromatosis.   ASSESSMENT & PLAN:   Hemochromatosis #Hereditary hemochromatosis/iron overload,   homozygous H63D mutation.  Labs done at [facility] were reviewed and discussed with patient. Iron saturation 56 Ferritin is 124,  slightly elevated transaminitis proceed with 300 cc phlebotomy weekly x 3  Macrocytosis without anemia Chronically macrocytic with no cytopenia.  Observation.  Follow-up in the clinic in 4 months with repeat blood work for evaluation of additional need of phlebotomy. [facility] prescriptions were provided to patient. All questions were answered. The patient knows to call the clinic with any problems, questions or concerns.  Rickard Patience, MD, PhD [facility] Hematology Oncology 02/16/2024     HISTORY OF PRESENTING ILLNESS:  Anna Mcgrath is a  63 y.o.  female with PMH listed below who was referred to me for evaluation of hemochromatosis.  Patient recent had lab work up done at [facility] office.  Reviewed lab report from [facility]  08/10/2018 Ferritin 446, wbc 6.7, Hemoglobin 13.8, MCV 99, normal differential, TSH 1.56, Creatinine 0.79,  She had a repeat test done on 08/14/2018  ferritin 580, serum iron 178, iron saturation 63% TIBC 284.  B12 496, Folate 16.9.  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  She reports chronic fatigue and also hand joint pain from chronic arthritis.   Denies family history of hemochromatosis. Her parents are deceased. Patient feels that " my parents may have passed one hemochromatosis gene to me".   INTERVAL HISTORY Anna Mcgrath is a 63 y.o. 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  Patient has had lab work done at [facility].   Review of Systems  Constitutional:  Positive for  malaise/fatigue. Negative for chills, fever and weight loss.  HENT:  Negative for sore throat.   Eyes:  Negative for redness.  Respiratory:  Negative for cough, shortness of breath and wheezing.   Cardiovascular:  Negative for chest pain, palpitations and leg swelling.  Gastrointestinal:  Negative for abdominal pain, blood in stool, heartburn, nausea and vomiting.  Genitourinary:  Negative for dysuria.  Musculoskeletal:  Negative for myalgias.  Skin:  Negative for rash.  Neurological:  Negative for dizziness, tingling and tremors.  Endo/Heme/Allergies:  Does not bruise/bleed easily.  Psychiatric/Behavioral:  Negative for hallucinations.     MEDICAL HISTORY:  Past Medical History:  Diagnosis Date   Asthma    Diabetes mellitus without complication (HCC) 03/25/2020   type II   Thyroid disease     SURGICAL HISTORY: Past Surgical History:  Procedure Laterality Date   APPENDECTOMY     BREAST BIOPSY Left 01/31/2016   Fibrocystic changes   BREAST BIOPSY Left 09/07/2021   Stereo Bx, X-clip, path pending   BREAST CYST ASPIRATION Left 01/19/2016   BREAST CYST ASPIRATION Left 03/10/2018   neg   HEMORRHOID SURGERY  1988    SOCIAL HISTORY: Social History   Socioeconomic History   Marital status: Married    Spouse name: [name]    Number of children: 2   Years of education: Not on file   Highest education level: Not on file  Occupational History   Not on file  Tobacco Use   Smoking status: Never   Smokeless tobacco: Never  Vaping Use   Vaping status: Never Used  Substance and Sexual Activity   Alcohol use:  Not Currently   Drug use: Never   Sexual activity: Not on file  Other Topics Concern   Not on file  Social History Narrative   Not on file   Social Drivers of Health   Financial Resource Strain: Low Risk  (11/20/2023)   Received from [facility]   Overall Financial Resource Strain (CARDIA)    Difficulty of Paying Living Expenses: Not hard at all   Food Insecurity: No Food Insecurity (11/20/2023)   Received from [facility]   Hunger Vital Sign    Worried About Running Out of Food in the Last Year: Never true    Ran Out of Food in the Last Year: Never true  Transportation Needs: No Transportation Needs (11/20/2023)   Received from [facility] - Transportation    In the past 12 months, has lack of transportation kept you from medical appointments or from getting medications?: No    Lack of Transportation (Non-Medical): No  Physical Activity: Not on file  Stress: Not on file  Social Connections: Not on file  Intimate Partner Violence: Not on file    FAMILY HISTORY: Family History  Problem Relation Age of Onset   Non-Hodgkin's lymphoma Brother    Cancer Maternal Grandfather    Breast cancer Neg Hx     ALLERGIES:  is allergic to azithromycin, gatifloxacin, and penicillin g.  MEDICATIONS:  Current Outpatient Medications  Medication Sig Dispense Refill   estradiol-norethindrone (ACTIVELLA) 1-0.5 MG tablet TAKE 1 TABLET BY MOUTH EVERY DAY     levothyroxine (SYNTHROID, LEVOTHROID) 88 MCG tablet Take 88 mcg by mouth daily.     Multiple Vitamins-Minerals (HM MULTIVITAMIN ADULT GUMMY PO) Take by mouth.     PREMARIN vaginal cream PLEASE SEE ATTACHED FOR DETAILED DIRECTIONS     rosuvastatin (CRESTOR) 5 MG tablet Take 5 mg by mouth daily.     RYBELSUS 7 MG TABS Take 1 tablet by mouth daily.     triamcinolone (NASACORT) 55 MCG/ACT AERO nasal inhaler Place 2 sprays into the nose daily.     No current facility-administered medications for this visit.     PHYSICAL EXAMINATION: ECOG PERFORMANCE STATUS: 0 - Asymptomatic Vitals:   02/16/24 1305  BP: 139/76  Pulse: 74  Resp: 18  Temp: 98.5 F (36.9 C)  SpO2: 100%   Filed Weights   02/16/24 1305  Weight: 137 lb 12.8 oz (62.5 kg)    Physical Exam Constitutional:      General: She is not in acute distress. HENT:     Head:  Normocephalic and atraumatic.     Right Ear: External ear normal.     Left Ear: External ear normal.  Eyes:     General: No scleral icterus.    Pupils: Pupils are equal, round, and reactive to light.  Cardiovascular:     Rate and Rhythm: Normal rate and regular rhythm.     Heart sounds: Normal heart sounds.  Pulmonary:     Effort: Pulmonary effort is normal. No respiratory distress.     Breath sounds: No wheezing.  Abdominal:     General: Bowel sounds are normal. There is no distension.     Palpations: Abdomen is soft. There is no mass.     Tenderness: There is no abdominal tenderness.  Musculoskeletal:        General: No deformity. Normal range of motion.     Cervical back: Normal range of motion and neck supple.  Skin:  General: Skin is warm and dry.     Findings: No erythema or rash.  Neurological:     Mental Status: She is alert and oriented to person, place, and time. Mental status is at baseline.     Cranial Nerves: No cranial nerve deficit.     Coordination: Coordination normal.  Psychiatric:        Mood and Affect: Mood normal.        Behavior: Behavior normal.        Thought Content: Thought content normal.      LABORATORY DATA:  [facility] labs reviewed.  11/27/2018 Hemoglobin 12, ferritin 23 01/25/2019 TSAT 31% ferritin 20 hemoglobin 13. 05/11/2019 hemoglobin 14.8, hematocrit 43.2, iron saturation 23%, ferritin 41 07/28/2019 hemoglobin 14.7, hematocrit 44.9 MCV 100 iron saturation 40%, ferritin 32 01/25/2020, hemoglobin 14.9, hematocrit 43.8, normal WBC and differential. Iron saturation 33, ferritin 39. 02/03/2021, creatinine 0.82, bilirubin 0.3, alkaline phosphatase 64, AST 15, ALT 12.  Hemoglobin 14.3, MCV 98,  TIBC 275, ferritin 64, iron saturation 38. 08/09/2021 Hb 14.5, MCV 98.normal LFT. TIBC 50, ferritin 70,  02/11/2022 ALT 47, ferritin 48, iron saturation 40, TIBC 340. 08/07/2022, hemoglobin 14.7, MCV 101, creatinine 0.85, ferritin 77, iron saturation 31,  alkaline phosphatase 66, AST 12, ALT 9, bilirubin 0.5. 02/06/2023 hemoglobin 14.6, hematocrit 24.6, MCV 100.  Creatinine 0.89, alkaline phosphatase 55, AST 15, ALT 13.  Bilirubin 0.6 ferritin 75, iron saturation 43, TIBC 287.  B12 636, folate>20  RADIOGRAPHIC STUDIES: I have personally reviewed the radiological images as listed and agreed with the findings in the report. 12/30/2018 ultrasound abdomen limited: Unremarkable right upper quadrant ultrasound. 01/18/2019 CT abdomen pelvis with contrast showed moderate fecal loading throughout the length of his prominent cystitis not excluded.  No other acute abnormalities

## 2024-02-17 LAB — CULTURE, URINE COMPREHENSIVE

## 2024-02-21 ENCOUNTER — Encounter: Payer: Self-pay | Admitting: Oncology

## 2024-02-24 ENCOUNTER — Inpatient Hospital Stay

## 2024-02-26 ENCOUNTER — Inpatient Hospital Stay

## 2024-03-02 ENCOUNTER — Inpatient Hospital Stay

## 2024-03-04 ENCOUNTER — Inpatient Hospital Stay

## 2024-03-04 ENCOUNTER — Telehealth: Payer: Self-pay | Admitting: Oncology

## 2024-03-04 NOTE — Telephone Encounter (Signed)
 Patient left a message to change appointment from 3/20 to 3/27 in the morning- I called patient back and left a voicemail that we only do phlebotomy in the afternoon. I also let her know I changed it to 3/27 as requested and if 1pm didn't work for her to let us know and we could change it to something that does.

## 2024-03-11 ENCOUNTER — Inpatient Hospital Stay

## 2024-03-11 MED ORDER — SODIUM CHLORIDE 0.9 % IV SOLN
Freq: Once | INTRAVENOUS | Status: AC
  Filled 2024-03-11: qty 250

## 2024-03-17 ENCOUNTER — Encounter: Payer: Self-pay | Admitting: Urology

## 2024-03-17 ENCOUNTER — Ambulatory Visit (INDEPENDENT_AMBULATORY_CARE_PROVIDER_SITE_OTHER): Payer: BC Managed Care – PPO | Admitting: Urology

## 2024-03-17 VITALS — BP 139/79 | HR 76 | Wt 137.0 lb

## 2024-03-17 DIAGNOSIS — R3129 Other microscopic hematuria: Secondary | ICD-10-CM

## 2024-03-17 LAB — MICROSCOPIC EXAMINATION: Epithelial Cells (non renal): >10 /HPF — AB (ref 0–10)

## 2024-03-17 LAB — URINALYSIS, COMPLETE
Bilirubin, UA: NEGATIVE
Glucose, UA: NEGATIVE
Ketones, UA: NEGATIVE
Nitrite, UA: NEGATIVE
Specific Gravity, UA: 1.02 (ref 1.005–1.030)
Urobilinogen, Ur: 0.2 mg/dL (ref 0.2–1.0)
pH, UA: 7 (ref 5.0–7.5)

## 2024-03-17 NOTE — Progress Notes (Signed)
   03/17/24  CC:  Chief Complaint  Patient presents with   Cysto    HPI: Refer to my previous note 02/13/2024.  UA today dipstick 1+ blood/trace protein/1+ leukocytes; microscopy 11-30 WBC/3-10 RBC/>10 epis  Blood pressure 139/79, pulse 76, weight 137 lb (62.1 kg), last menstrual period 03/21/2017. NED. A&Ox3.   No respiratory distress   Abd soft, NT, ND Normal external genitalia with patent urethral meatus  Cystoscopy Procedure Note  Patient identification was confirmed, informed consent was obtained, and patient was prepped using Betadine solution.  Lidocaine jelly was administered per urethral meatus.    Procedure: - Flexible cystoscope introduced, without any difficulty.   - Thorough search of the bladder revealed:    normal urethral meatus    normal urothelium    no stones    no ulcers     no tumors    no urethral polyps    no trabeculation  - Ureteral orifices were normal in position and appearance.  Post-Procedure: - Patient tolerated the procedure well  Assessment/ Plan: No mucosal abnormalities on cystoscopy UA today with significant vaginal contamination 94-month follow-up with UA.  CT urogram for increasing microhematuria or development of gross hematuria    Riki Altes, MD

## 2024-04-28 ENCOUNTER — Ambulatory Visit: Payer: Self-pay | Admitting: Urology

## 2024-04-28 ENCOUNTER — Other Ambulatory Visit

## 2024-04-28 DIAGNOSIS — R3129 Other microscopic hematuria: Secondary | ICD-10-CM

## 2024-04-28 LAB — URINALYSIS, COMPLETE
Bilirubin, UA: NEGATIVE
Glucose, UA: NEGATIVE
Ketones, UA: NEGATIVE
Leukocytes,UA: NEGATIVE
Nitrite, UA: NEGATIVE
Protein,UA: NEGATIVE
Specific Gravity, UA: 1.01 (ref 1.005–1.030)
Urobilinogen, Ur: 0.2 mg/dL (ref 0.2–1.0)
pH, UA: 6.5 (ref 5.0–7.5)

## 2024-04-28 LAB — MICROSCOPIC EXAMINATION

## 2024-06-21 ENCOUNTER — Encounter: Payer: Self-pay | Admitting: Oncology

## 2024-06-21 ENCOUNTER — Inpatient Hospital Stay

## 2024-06-21 ENCOUNTER — Inpatient Hospital Stay: Attending: Oncology | Admitting: Oncology

## 2024-06-21 DIAGNOSIS — D7589 Other specified diseases of blood and blood-forming organs: Secondary | ICD-10-CM | POA: Diagnosis not present

## 2024-06-21 MED ORDER — SODIUM CHLORIDE 0.9 % IV SOLN
Freq: Once | INTRAVENOUS | Status: DC
Start: 2024-06-21 — End: 2024-06-21
  Filled 2024-06-21: qty 250

## 2024-06-21 NOTE — Patient Instructions (Signed)

## 2024-06-21 NOTE — Assessment & Plan Note (Signed)
 Chronically macrocytic with no cytopenia.  MC 100, stable. Observation.

## 2024-06-21 NOTE — Progress Notes (Signed)
 Anna Mcgrath presents today for phlebotomy per MD orders. Phlebotomy procedure started at 1354 and ended at 1407. 300 mls removed. Patient tolerated procedure well. IV needle removed intact.

## 2024-06-21 NOTE — Assessment & Plan Note (Addendum)
#  Hereditary hemochromatosis/iron overload,   homozygous H63D mutation.  Labs done at [facility] were reviewed and discussed with patient. Iron saturation 47 Ferritin is 67. Goal of ferritin is 50 proceed with 300 cc phlebotomy x 1

## 2024-06-21 NOTE — Progress Notes (Signed)
 Hematology/Oncology Progress note [phone] [phone]    REASON FOR VISIT Follow up for treatment of iron overload and hemochromatosis.   ASSESSMENT & PLAN:   Hemochromatosis #Hereditary hemochromatosis/iron overload,   homozygous H63D mutation.  Labs done at [facility] were reviewed and discussed with patient. Iron saturation 47 Ferritin is 67. Goal of ferritin is 50 proceed with 300 cc phlebotomy x 1  Macrocytosis without anemia Chronically macrocytic with no cytopenia.  MC 100, stable. Observation.  Follow-up in the clinic in 6 months with repeat blood work for evaluation of additional need of phlebotomy. [facility] prescriptions were provided to patient. All questions were answered. The patient knows to call the clinic with any problems, questions or concerns.  Zelphia Cap, MD, PhD [facility] Hematology Oncology 06/21/2024     HISTORY OF PRESENTING ILLNESS:  Anna Mcgrath is a  63 y.o.  female with PMH listed below who was referred to me for evaluation of hemochromatosis.  Patient recent had lab work up done at [employer] [facility] [employer] office.  Reviewed lab report from [facility]  08/10/2018 Ferritin 446, wbc 6.7, Hemoglobin 13.8, MCV 99, normal differential, TSH 1.56, Creatinine 0.79,  She had a repeat test done on 08/14/2018  ferritin 580, serum iron 178, iron saturation 63% TIBC 284.  B12 496, Folate 16.9.  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  She reports chronic fatigue and also hand joint pain from chronic arthritis.   Denies family history of hemochromatosis. Her parents are deceased. Patient feels that  my parents may have passed one hemochromatosis gene to me.   INTERVAL HISTORY Anna Mcgrath is a 63 y.o. 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  Patient has had lab work done at [facility].   Review of Systems  Constitutional:  Positive for  malaise/fatigue. Negative for chills, fever and weight loss.  HENT:  Negative for sore throat.   Eyes:  Negative for redness.  Respiratory:  Negative for cough, shortness of breath and wheezing.   Cardiovascular:  Negative for chest pain, palpitations and leg swelling.  Gastrointestinal:  Negative for abdominal pain, blood in stool, heartburn, nausea and vomiting.  Genitourinary:  Negative for dysuria.  Musculoskeletal:  Negative for myalgias.  Skin:  Negative for rash.  Neurological:  Negative for dizziness, tingling and tremors.  Endo/Heme/Allergies:  Does not bruise/bleed easily.  Psychiatric/Behavioral:  Negative for hallucinations.     MEDICAL HISTORY:  Past Medical History:  Diagnosis Date   Asthma    Diabetes mellitus without complication (HCC) 03/25/2020   type II   Thyroid  disease     SURGICAL HISTORY: Past Surgical History:  Procedure Laterality Date   APPENDECTOMY     BREAST BIOPSY Left 01/31/2016   Fibrocystic changes   BREAST BIOPSY Left 09/07/2021   Stereo Bx, X-clip, path pending   BREAST CYST ASPIRATION Left 01/19/2016   BREAST CYST ASPIRATION Left 03/10/2018   neg   HEMORRHOID SURGERY  1988    SOCIAL HISTORY: Social History   Socioeconomic History   Marital status: Married    Spouse name: [name]    Number of children: 2   Years of education: Not on file   Highest education level: Not on file  Occupational History   Not on file  Tobacco Use   Smoking status: Never   Smokeless tobacco: Never  Vaping Use   Vaping status: Never Used  Substance and Sexual Activity  Alcohol use: Not Currently   Drug use: Never   Sexual activity: Not on file  Other Topics Concern   Not on file  Social History Narrative   Not on file   Social Drivers of Health   Financial Resource Strain: Low Risk  (11/20/2023)   Received from [facility]   Overall Financial Resource Strain (CARDIA)    Difficulty of Paying Living Expenses: Not hard at all   Food Insecurity: No Food Insecurity (11/20/2023)   Received from [facility]   Hunger Vital Sign    Within the past 12 months, you worried that your food would run out before you got the money to buy more.: Never true    Within the past 12 months, the food you bought just didn't last and you didn't have money to get more.: Never true  Transportation Needs: No Transportation Needs (11/20/2023)   Received from [facility] - Transportation    In the past 12 months, has lack of transportation kept you from medical appointments or from getting medications?: No    Lack of Transportation (Non-Medical): No  Physical Activity: Not on file  Stress: Not on file  Social Connections: Not on file  Intimate Partner Violence: Not on file    FAMILY HISTORY: Family History  Problem Relation Age of Onset   Non-Hodgkin's lymphoma Brother    Cancer Maternal Grandfather    Breast cancer Neg Hx     ALLERGIES:  is allergic to azithromycin, gatifloxacin, and penicillin g.  MEDICATIONS:  Current Outpatient Medications  Medication Sig Dispense Refill   estradiol-norethindrone (ACTIVELLA) 1-0.5 MG tablet TAKE 1 TABLET BY MOUTH EVERY DAY     levothyroxine (SYNTHROID, LEVOTHROID) 88 MCG tablet Take 88 mcg by mouth daily.     Multiple Vitamins-Minerals (HM MULTIVITAMIN ADULT GUMMY PO) Take by mouth.     PREMARIN vaginal cream PLEASE SEE ATTACHED FOR DETAILED DIRECTIONS     rosuvastatin (CRESTOR) 5 MG tablet Take 5 mg by mouth daily.     RYBELSUS 7 MG TABS Take 1 tablet by mouth daily.     triamcinolone (NASACORT) 55 MCG/ACT AERO nasal inhaler Place 2 sprays into the nose daily.     No current facility-administered medications for this visit.     PHYSICAL EXAMINATION: ECOG PERFORMANCE STATUS: 0 - Asymptomatic Vitals:   06/21/24 1320  BP: 139/66  Pulse: 70  Resp: 18  Temp: (!) 96.9 F (36.1 C)  SpO2: 100%   Filed Weights   06/21/24 1320  Weight: 137  lb 9.6 oz (62.4 kg)    Physical Exam Constitutional:      General: She is not in acute distress. HENT:     Head: Normocephalic and atraumatic.     Right Ear: External ear normal.     Left Ear: External ear normal.  Eyes:     General: No scleral icterus.    Pupils: Pupils are equal, round, and reactive to light.  Cardiovascular:     Rate and Rhythm: Normal rate and regular rhythm.     Heart sounds: Normal heart sounds.  Pulmonary:     Effort: Pulmonary effort is normal. No respiratory distress.     Breath sounds: No wheezing.  Abdominal:     General: Bowel sounds are normal. There is no distension.     Palpations: Abdomen is soft. There is no mass.     Tenderness: There is no abdominal tenderness.  Musculoskeletal:  General: No deformity. Normal range of motion.     Cervical back: Normal range of motion and neck supple.  Skin:    General: Skin is warm and dry.     Findings: No erythema or rash.  Neurological:     Mental Status: She is alert and oriented to person, place, and time. Mental status is at baseline.     Cranial Nerves: No cranial nerve deficit.     Coordination: Coordination normal.  Psychiatric:        Mood and Affect: Mood normal.        Behavior: Behavior normal.        Thought Content: Thought content normal.      LABORATORY DATA:  [facility] labs reviewed.  11/27/2018 Hemoglobin 12, ferritin 23 01/25/2019 TSAT 31% ferritin 20 hemoglobin 13. 05/11/2019 hemoglobin 14.8, hematocrit 43.2, iron saturation 23%, ferritin 41 07/28/2019 hemoglobin 14.7, hematocrit 44.9 MCV 100 iron saturation 40%, ferritin 32 01/25/2020, hemoglobin 14.9, hematocrit 43.8, normal WBC and differential. Iron saturation 33, ferritin 39. 02/03/2021, creatinine 0.82, bilirubin 0.3, alkaline phosphatase 64, AST 15, ALT 12.  Hemoglobin 14.3, MCV 98,  TIBC 275, ferritin 64, iron saturation 38. 08/09/2021 Hb 14.5, MCV 98.normal LFT. TIBC 50, ferritin 70,  02/11/2022 ALT 47, ferritin 48,  iron saturation 40, TIBC 340. 08/07/2022, hemoglobin 14.7, MCV 101, creatinine 0.85, ferritin 77, iron saturation 31, alkaline phosphatase 66, AST 12, ALT 9, bilirubin 0.5. 02/06/2023 hemoglobin 14.6, hematocrit 24.6, MCV 100.  Creatinine 0.89, alkaline phosphatase 55, AST 15, ALT 13.  Bilirubin 0.6 ferritin 75, iron saturation 43, TIBC 287.  B12 636, folate>20  RADIOGRAPHIC STUDIES: I have personally reviewed the radiological images as listed and agreed with the findings in the report. 12/30/2018 ultrasound abdomen limited: Unremarkable right upper quadrant ultrasound. 01/18/2019 CT abdomen pelvis with contrast showed moderate fecal loading throughout the length of his prominent cystitis not excluded.  No other acute abnormalities

## 2024-06-22 ENCOUNTER — Encounter: Payer: Self-pay | Admitting: Oncology

## 2024-07-09 ENCOUNTER — Encounter: Payer: Self-pay | Admitting: Urology

## 2024-09-14 ENCOUNTER — Ambulatory Visit: Admitting: Urology

## 2024-09-16 ENCOUNTER — Ambulatory Visit: Admitting: Urology

## 2024-12-22 ENCOUNTER — Encounter

## 2024-12-22 ENCOUNTER — Ambulatory Visit: Admitting: Oncology

## 2025-01-06 ENCOUNTER — Encounter: Payer: Self-pay | Admitting: Oncology

## 2025-01-12 ENCOUNTER — Encounter: Payer: Self-pay | Admitting: Oncology

## 2025-01-12 ENCOUNTER — Inpatient Hospital Stay

## 2025-01-12 ENCOUNTER — Inpatient Hospital Stay: Attending: Oncology | Admitting: Oncology

## 2025-01-12 NOTE — Patient Instructions (Signed)

## 2025-01-12 NOTE — Progress Notes (Signed)
 Anna Mcgrath presents today for phlebotomy per MD orders. Phlebotomy procedure started at 1622  and ended at 1633.  300 mls removed. Patient tolerated procedure well. IV needle removed intact.

## 2025-01-12 NOTE — Assessment & Plan Note (Signed)
#  Hereditary hemochromatosis/iron overload,   homozygous H63D mutation.  Labs done at [facility] were reviewed and discussed with patient. Ferritin is 104. Goal of ferritin is 50 proceed with 300 cc phlebotomy and repeat phlebotomy in 2 to 3 weeks.

## 2025-01-12 NOTE — Progress Notes (Signed)
 " Hematology/Oncology Progress note [phone] [phone]    REASON FOR VISIT Follow up for treatment of iron overload and hemochromatosis.   ASSESSMENT & PLAN:   Hemochromatosis #Hereditary hemochromatosis/iron overload,   homozygous H63D mutation.  Labs done at [facility] were reviewed and discussed with patient. Ferritin is 104. Goal of ferritin is 50 proceed with 300 cc phlebotomy and repeat phlebotomy in 2 to 3 weeks.  Follow-up in the clinic in 6 months with repeat blood work for evaluation of additional need of phlebotomy. [facility] prescriptions were provided to patient. All questions were answered. The patient knows to call the clinic with any problems, questions or concerns.  Zelphia Cap, MD, PhD [facility] Hematology Oncology 01/12/2025     HISTORY OF PRESENTING ILLNESS:  Anna Mcgrath is a  64 y.o.  female with PMH listed below who was referred to me for evaluation of hemochromatosis.  Patient recent had lab work up done at [facility] office.  Reviewed lab report from [facility].  08/10/2018 Ferritin 446, wbc 6.7, Hemoglobin 13.8, MCV 99, normal differential, TSH 1.56, Creatinine 0.79,  She had a repeat test done on 08/14/2018  ferritin 580, serum iron 178, iron saturation 63% TIBC 284.  B12 496, Folate 16.9.  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  She reports chronic fatigue and also hand joint pain from chronic arthritis.   Denies family history of hemochromatosis. Her parents are deceased. Patient feels that  my parents may have passed one hemochromatosis gene to me.   INTERVAL HISTORY Anna Mcgrath is a 64 y.o. 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  Patient has had lab work done at [facility]. Patient has no new complaints.  She watches her diet carefully and avoid food rich in iron and vitamin C.  Review of Systems  Constitutional:   Positive for malaise/fatigue. Negative for chills, fever and weight loss.  HENT:  Negative for sore throat.   Eyes:  Negative for redness.  Respiratory:  Negative for cough, shortness of breath and wheezing.   Cardiovascular:  Negative for chest pain, palpitations and leg swelling.  Gastrointestinal:  Negative for abdominal pain, blood in stool, heartburn, nausea and vomiting.  Genitourinary:  Negative for dysuria.  Musculoskeletal:  Negative for myalgias.  Skin:  Negative for rash.  Neurological:  Negative for dizziness, tingling and tremors.  Endo/Heme/Allergies:  Does not bruise/bleed easily.  Psychiatric/Behavioral:  Negative for hallucinations.     MEDICAL HISTORY:  Past Medical History:  Diagnosis Date   Asthma    Diabetes mellitus without complication (HCC) 03/25/2020   type II   Thyroid  disease     SURGICAL HISTORY: Past Surgical History:  Procedure Laterality Date   APPENDECTOMY     BREAST BIOPSY Left 01/31/2016   Fibrocystic changes   BREAST BIOPSY Left 09/07/2021   Stereo Bx, X-clip, path pending   BREAST CYST ASPIRATION Left 01/19/2016   BREAST CYST ASPIRATION Left 03/10/2018   neg   HEMORRHOID SURGERY  1988    SOCIAL HISTORY: Social History   Socioeconomic History   Marital status: Married    Spouse name: [name]    Number of children: 2   Years of education: Not on file   Highest education level: Not on file  Occupational History   Not on file  Tobacco Use   Smoking status: Never   Smokeless tobacco: Never  Vaping Use   Vaping  status: Never Used  Substance and Sexual Activity   Alcohol use: Not Currently   Drug use: Never   Sexual activity: Not on file  Other Topics Concern   Not on file  Social History Narrative   Not on file   Social Drivers of Health   Tobacco Use: Low Risk (01/12/2025)   Patient History    Smoking Tobacco Use: Never    Smokeless Tobacco Use: Never    Passive Exposure: Not on file  Financial Resource Strain: Low Risk   (01/10/2025)   Received from [facility]   Overall Financial Resource Strain (CARDIA)    Difficulty of Paying Living Expenses: Not hard at all  Food Insecurity: No Food Insecurity (01/10/2025)   Received from [facility]   Epic    Within the past 12 months, you worried that your food would run out before you got the money to buy more.: Never true    Within the past 12 months, the food you bought just didn't last and you didn't have money to get more.: Never true  Transportation Needs: No Transportation Needs (01/10/2025)   Received from [facility] - Transportation    In the past 12 months, has lack of transportation kept you from medical appointments or from getting medications?: No    Lack of Transportation (Non-Medical): No  Physical Activity: Not on file  Stress: Not on file  Social Connections: Not on file  Intimate Partner Violence: Not on file  Depression (EYV7-0): Not on file  Alcohol Screen: Not on file  Housing: Low Risk  (01/10/2025)   Received from [facility]   Epic    In the last 12 months, was there a time when you were not able to pay the mortgage or rent on time?: No    In the past 12 months, how many times have you moved where you were living?: 0    At any time in the past 12 months, were you homeless or living in a shelter (including now)?: No  Utilities: Not At Risk (01/10/2025)   Received from [facility]   Epic    In the past 12 months has the electric, gas, oil, or water company threatened to shut off services in your home?: No  Health Literacy: Not on file    FAMILY HISTORY: Family History  Problem Relation Age of Onset   Non-Hodgkin's lymphoma Brother    Cancer Maternal Grandfather    Breast cancer Neg Hx     ALLERGIES:  is allergic to azithromycin, gatifloxacin, and penicillin g.  MEDICATIONS:  Current Outpatient Medications  Medication Sig Dispense  Refill   albuterol (VENTOLIN HFA) 108 (90 Base) MCG/ACT inhaler Inhale 1 puff into the lungs every 6 (six) hours as needed for wheezing or shortness of breath.     beclomethasone (QVAR) 80 MCG/ACT inhaler Inhale 1 puff into the lungs.     cholecalciferol (VITAMIN D3) 25 MCG (1000 UNIT) tablet Take 1,000 Units by mouth daily.     levothyroxine (SYNTHROID, LEVOTHROID) 88 MCG tablet Take 88 mcg by mouth daily.     Multiple Vitamins-Minerals (HM MULTIVITAMIN ADULT GUMMY PO) Take by mouth.     rosuvastatin (CRESTOR) 5 MG tablet Take 5 mg by mouth daily.     RYBELSUS 7 MG TABS Take 1 tablet by mouth daily.     triamcinolone (NASACORT) 55 MCG/ACT AERO nasal inhaler Place 2 sprays into the nose  daily.     estradiol-norethindrone (ACTIVELLA) 1-0.5 MG tablet TAKE 1 TABLET BY MOUTH EVERY DAY (Patient not taking: Reported on 01/12/2025)     PREMARIN vaginal cream PLEASE SEE ATTACHED FOR DETAILED DIRECTIONS (Patient not taking: Reported on 01/12/2025)     No current facility-administered medications for this visit.     PHYSICAL EXAMINATION: ECOG PERFORMANCE STATUS: 0 - Asymptomatic Vitals:   01/12/25 1536 01/12/25 1548  BP: (!) 147/98 (!) 144/78  Pulse: 74   Resp: 18   Temp: (!) 96.7 F (35.9 C)   SpO2: 100%    Filed Weights   01/12/25 1536  Weight: 140 lb 11.2 oz (63.8 kg)    Physical Exam Constitutional:      General: She is not in acute distress. HENT:     Head: Normocephalic and atraumatic.     Right Ear: External ear normal.     Left Ear: External ear normal.  Eyes:     General: No scleral icterus.    Pupils: Pupils are equal, round, and reactive to light.  Cardiovascular:     Rate and Rhythm: Normal rate and regular rhythm.     Heart sounds: Normal heart sounds.  Pulmonary:     Effort: Pulmonary effort is normal. No respiratory distress.     Breath sounds: No wheezing.  Abdominal:     General: Bowel sounds are normal. There is no distension.     Palpations: Abdomen is soft.  There is no mass.     Tenderness: There is no abdominal tenderness.  Musculoskeletal:        General: No deformity. Normal range of motion.     Cervical back: Normal range of motion and neck supple.  Skin:    General: Skin is warm and dry.     Findings: No erythema or rash.  Neurological:     Mental Status: She is alert and oriented to person, place, and time. Mental status is at baseline.     Cranial Nerves: No cranial nerve deficit.     Coordination: Coordination normal.  Psychiatric:        Mood and Affect: Mood normal.        Behavior: Behavior normal.        Thought Content: Thought content normal.      LABORATORY DATA:  [facility] labs reviewed.  11/27/2018 Hemoglobin 12, ferritin 23 01/25/2019 TSAT 31% ferritin 20 hemoglobin 13. 05/11/2019 hemoglobin 14.8, hematocrit 43.2, iron saturation 23%, ferritin 41 07/28/2019 hemoglobin 14.7, hematocrit 44.9 MCV 100 iron saturation 40%, ferritin 32 01/25/2020, hemoglobin 14.9, hematocrit 43.8, normal WBC and differential. Iron saturation 33, ferritin 39. 02/03/2021, creatinine 0.82, bilirubin 0.3, alkaline phosphatase 64, AST 15, ALT 12.  Hemoglobin 14.3, MCV 98,  TIBC 275, ferritin 64, iron saturation 38. 08/09/2021 Hb 14.5, MCV 98.normal LFT. TIBC 50, ferritin 70,  02/11/2022 ALT 47, ferritin 48, iron saturation 40, TIBC 340. 08/07/2022, hemoglobin 14.7, MCV 101, creatinine 0.85, ferritin 77, iron saturation 31, alkaline phosphatase 66, AST 12, ALT 9, bilirubin 0.5. 02/06/2023 hemoglobin 14.6, hematocrit 24.6, MCV 100.  Creatinine 0.89, alkaline phosphatase 55, AST 15, ALT 13.  Bilirubin 0.6 ferritin 75, iron saturation 43, TIBC 287.  B12 636, folate>20  RADIOGRAPHIC STUDIES: I have personally reviewed the radiological images as listed and agreed with the findings in the report. 12/30/2018 ultrasound abdomen limited: Unremarkable right upper quadrant ultrasound. 01/18/2019 CT abdomen pelvis with contrast showed moderate fecal loading throughout  the length of his prominent cystitis not excluded.  No other acute  abnormalities  "

## 2025-01-14 ENCOUNTER — Telehealth: Payer: Self-pay | Admitting: Oncology

## 2025-01-14 NOTE — Telephone Encounter (Signed)
 Pt called and scheduled her 6 months appts

## 2025-01-26 ENCOUNTER — Inpatient Hospital Stay

## 2025-07-12 ENCOUNTER — Inpatient Hospital Stay

## 2025-07-12 ENCOUNTER — Inpatient Hospital Stay: Admitting: Oncology
